# Patient Record
Sex: Female | Born: 1985 | Race: Black or African American | Hispanic: No | Marital: Single | Smoking: Never smoker
Health system: Southern US, Community
[De-identification: ages and names within clinical notes are randomized; demographics above are authoritative.]

## PROBLEM LIST (undated history)

## (undated) DIAGNOSIS — K811 Chronic cholecystitis: Secondary | ICD-10-CM

## (undated) DIAGNOSIS — I10 Essential (primary) hypertension: Secondary | ICD-10-CM

## (undated) DIAGNOSIS — N939 Abnormal uterine and vaginal bleeding, unspecified: Secondary | ICD-10-CM

## (undated) DIAGNOSIS — R0789 Other chest pain: Secondary | ICD-10-CM

## (undated) DIAGNOSIS — E119 Type 2 diabetes mellitus without complications: Secondary | ICD-10-CM

## (undated) DIAGNOSIS — K760 Fatty (change of) liver, not elsewhere classified: Secondary | ICD-10-CM

## (undated) DIAGNOSIS — K8020 Calculus of gallbladder without cholecystitis without obstruction: Secondary | ICD-10-CM

## (undated) HISTORY — DX: Chronic cholecystitis: K81.1

---
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
## 2007-03-10 ENCOUNTER — Emergency Department: Payer: Self-pay | Admitting: Internal Medicine

## 2007-03-28 ENCOUNTER — Emergency Department: Payer: Self-pay | Admitting: Emergency Medicine

## 2007-05-11 ENCOUNTER — Emergency Department: Payer: Self-pay | Admitting: Emergency Medicine

## 2007-05-29 ENCOUNTER — Emergency Department: Payer: Self-pay | Admitting: Emergency Medicine

## 2007-12-19 ENCOUNTER — Emergency Department: Payer: Self-pay | Admitting: Emergency Medicine

## 2008-01-23 ENCOUNTER — Emergency Department: Payer: Self-pay | Admitting: Emergency Medicine

## 2008-04-03 ENCOUNTER — Emergency Department: Payer: Self-pay | Admitting: Emergency Medicine

## 2008-08-19 ENCOUNTER — Observation Stay: Payer: Self-pay | Admitting: Obstetrics and Gynecology

## 2008-08-29 ENCOUNTER — Observation Stay: Payer: Self-pay | Admitting: Obstetrics & Gynecology

## 2008-09-14 ENCOUNTER — Inpatient Hospital Stay: Payer: Self-pay | Admitting: Obstetrics & Gynecology

## 2008-11-13 ENCOUNTER — Emergency Department: Payer: Self-pay | Admitting: Emergency Medicine

## 2010-07-01 ENCOUNTER — Emergency Department: Payer: Self-pay | Admitting: Unknown Physician Specialty

## 2011-03-15 ENCOUNTER — Emergency Department: Payer: Self-pay | Admitting: Emergency Medicine

## 2011-12-10 ENCOUNTER — Emergency Department: Payer: Self-pay | Admitting: Emergency Medicine

## 2011-12-10 LAB — RAPID INFLUENZA A&B ANTIGENS

## 2012-07-07 ENCOUNTER — Emergency Department: Payer: Self-pay | Admitting: Unknown Physician Specialty

## 2012-07-07 LAB — URINALYSIS, COMPLETE
Bilirubin,UR: NEGATIVE
Glucose,UR: NEGATIVE mg/dL (ref 0–75)
Leukocyte Esterase: NEGATIVE
Ph: 6 (ref 4.5–8.0)
Protein: NEGATIVE
RBC,UR: 2 /HPF (ref 0–5)
Specific Gravity: 1.024 (ref 1.003–1.030)
Squamous Epithelial: 5
WBC UR: 2 /HPF (ref 0–5)

## 2013-05-30 ENCOUNTER — Emergency Department: Payer: Self-pay | Admitting: Emergency Medicine

## 2013-05-30 LAB — CK TOTAL AND CKMB (NOT AT ARMC): CK, Total: 56 U/L

## 2013-05-30 LAB — URINALYSIS, COMPLETE
BILIRUBIN, UR: NEGATIVE
BLOOD: NEGATIVE
Glucose,UR: NEGATIVE mg/dL (ref 0–75)
Nitrite: NEGATIVE
PH: 7 (ref 4.5–8.0)
Protein: NEGATIVE
RBC,UR: 1 /HPF (ref 0–5)
Specific Gravity: 1.018 (ref 1.003–1.030)

## 2013-05-30 LAB — COMPREHENSIVE METABOLIC PANEL
ALBUMIN: 2.9 g/dL — AB (ref 3.4–5.0)
ALK PHOS: 46 U/L
ALT: 16 U/L (ref 12–78)
Anion Gap: 7 (ref 7–16)
BILIRUBIN TOTAL: 0.4 mg/dL (ref 0.2–1.0)
BUN: 5 mg/dL — ABNORMAL LOW (ref 7–18)
CALCIUM: 8.5 mg/dL (ref 8.5–10.1)
Chloride: 104 mmol/L (ref 98–107)
Co2: 26 mmol/L (ref 21–32)
Creatinine: 0.38 mg/dL — ABNORMAL LOW (ref 0.60–1.30)
Glucose: 82 mg/dL (ref 65–99)
OSMOLALITY: 270 (ref 275–301)
Potassium: 3.4 mmol/L — ABNORMAL LOW (ref 3.5–5.1)
SGOT(AST): 10 U/L — ABNORMAL LOW (ref 15–37)
Sodium: 137 mmol/L (ref 136–145)
Total Protein: 7.1 g/dL (ref 6.4–8.2)

## 2013-05-30 LAB — CBC
HCT: 35.3 % (ref 35.0–47.0)
HGB: 11.7 g/dL — ABNORMAL LOW (ref 12.0–16.0)
MCH: 27.2 pg (ref 26.0–34.0)
MCHC: 33.2 g/dL (ref 32.0–36.0)
MCV: 82 fL (ref 80–100)
PLATELETS: 173 10*3/uL (ref 150–440)
RBC: 4.32 10*6/uL (ref 3.80–5.20)
RDW: 13.8 % (ref 11.5–14.5)
WBC: 7.6 10*3/uL (ref 3.6–11.0)

## 2013-05-30 LAB — TROPONIN I: Troponin-I: <0.02 ng/mL

## 2013-09-03 ENCOUNTER — Inpatient Hospital Stay: Payer: Self-pay

## 2013-09-03 LAB — COMPREHENSIVE METABOLIC PANEL
ALBUMIN: 2.7 g/dL — AB (ref 3.4–5.0)
Alkaline Phosphatase: 139 U/L — ABNORMAL HIGH
Anion Gap: 9 (ref 7–16)
BUN: 7 mg/dL (ref 7–18)
Bilirubin,Total: 0.3 mg/dL (ref 0.2–1.0)
Calcium, Total: 8.4 mg/dL — ABNORMAL LOW (ref 8.5–10.1)
Chloride: 109 mmol/L — ABNORMAL HIGH (ref 98–107)
Co2: 23 mmol/L (ref 21–32)
Creatinine: 0.66 mg/dL (ref 0.60–1.30)
Glucose: 78 mg/dL (ref 65–99)
Osmolality: 278 (ref 275–301)
Potassium: 3.9 mmol/L (ref 3.5–5.1)
SGOT(AST): 18 U/L (ref 15–37)
SGPT (ALT): 10 U/L — ABNORMAL LOW
Sodium: 141 mmol/L (ref 136–145)
Total Protein: 7 g/dL (ref 6.4–8.2)

## 2013-09-03 LAB — CBC WITH DIFFERENTIAL/PLATELET
BASOS ABS: 0.1 10*3/uL (ref 0.0–0.1)
Basophil %: 0.9 %
EOS ABS: 0.1 10*3/uL (ref 0.0–0.7)
EOS PCT: 0.9 %
HCT: 38 % (ref 35.0–47.0)
HGB: 12.8 g/dL (ref 12.0–16.0)
Lymphocyte #: 3.1 10*3/uL (ref 1.0–3.6)
Lymphocyte %: 36.3 %
MCH: 28 pg (ref 26.0–34.0)
MCHC: 33.7 g/dL (ref 32.0–36.0)
MCV: 83 fL (ref 80–100)
MONOS PCT: 11.8 %
Monocyte #: 1 x10 3/mm — ABNORMAL HIGH (ref 0.2–0.9)
NEUTROS PCT: 50.1 %
Neutrophil #: 4.3 10*3/uL (ref 1.4–6.5)
Platelet: 172 10*3/uL (ref 150–440)
RBC: 4.57 10*6/uL (ref 3.80–5.20)
RDW: 13.8 % (ref 11.5–14.5)
WBC: 8.5 10*3/uL (ref 3.6–11.0)

## 2013-09-03 LAB — PROTEIN / CREATININE RATIO, URINE
CREATININE, URINE: 126.6 mg/dL — AB (ref 30.0–125.0)
PROTEIN/CREAT. RATIO: 142 mg/g{creat} (ref 0–200)
Protein, Random Urine: 18 mg/dL — ABNORMAL HIGH (ref 0–12)

## 2013-09-04 LAB — CBC WITH DIFFERENTIAL/PLATELET
BASOS ABS: 0 10*3/uL (ref 0.0–0.1)
BASOS PCT: 0.6 %
EOS PCT: 1 %
Eosinophil #: 0.1 10*3/uL (ref 0.0–0.7)
HCT: 38.1 % (ref 35.0–47.0)
HGB: 12.6 g/dL (ref 12.0–16.0)
Lymphocyte #: 3.1 10*3/uL (ref 1.0–3.6)
Lymphocyte %: 38.2 %
MCH: 27.8 pg (ref 26.0–34.0)
MCHC: 33 g/dL (ref 32.0–36.0)
MCV: 84 fL (ref 80–100)
Monocyte #: 0.7 x10 3/mm (ref 0.2–0.9)
Monocyte %: 8.8 %
Neutrophil #: 4.2 10*3/uL (ref 1.4–6.5)
Neutrophil %: 51.4 %
Platelet: 160 10*3/uL (ref 150–440)
RBC: 4.53 10*6/uL (ref 3.80–5.20)
RDW: 14.2 % (ref 11.5–14.5)
WBC: 8.1 10*3/uL (ref 3.6–11.0)

## 2013-09-04 LAB — BASIC METABOLIC PANEL WITH GFR
Anion Gap: 9
BUN: 3 mg/dL — ABNORMAL LOW
Calcium, Total: 7.4 mg/dL — ABNORMAL LOW
Chloride: 106 mmol/L
Co2: 23 mmol/L
Creatinine: 0.55 mg/dL — ABNORMAL LOW
EGFR (African American): >60
EGFR (Non-African Amer.): >60
Glucose: 80 mg/dL
Osmolality: 271
Potassium: 3.6 mmol/L
Sodium: 138 mmol/L

## 2013-09-04 LAB — PLATELET COUNT: Platelet: 175 10*3/uL (ref 150–440)

## 2013-09-05 LAB — HEMATOCRIT: HCT: 36.3 % (ref 35.0–47.0)

## 2013-09-07 LAB — PATHOLOGY REPORT

## 2014-05-22 NOTE — H&P (Signed)
L&D Evaluation:  History:  HPI -CC: HA and blurry vision -HPI: [age] y/o G2P1001 @ 37/2 (LMP=18wk u/s), with above CC. Preg c/b cHTN (poorly controlled and recently started on procardia xl), BMI 36, failed 1hr and passed 3hr  Patient states that starting at 1400 today she started having a HA and then took a tylenol but with no relief. She also states she's having some blurry vision but no spots in her vision, UCs/abdominal pain, VB, LOF or decreased FM. Last Procardia was at 2200 yesterday, which is her usual time   Patient's Surgical History none   Medications Pre [place]atal Vitamins  procardia 30mg  xl qhs   Allergies NKDA   Social History none   Family History Non-Contributory   Exam:  Vital Signs 150s/90s (SBP 170s/100s in ER), all other VS normal and stable   Urine Protein trace   General no apparent distress   Mental Status clear   Chest CTAB   Heart RRR, no MRGs   Abdomen gravid, non-tender   Estimated Fetal Weight Average for gestational age   Fetal Position cephalic   Edema 1+  in LE b/l and symmetric   Reflexes 1+  brachial   Clonus negative   Pelvic 1/50/high/posterior/intermediate; leopolds cephalic   Mebranes Intact   FHT 140 baseline, +accels, no decels, mod var   Ucx quiet   Impression:  Impression Pre-eclampsia with severe featus based on neuro s/s   Plan:  Comments *IUP: category I tracing, fetal status reassuring *Pre-eclampsia with severe features: based on neuro s/s. BPs are also above normal for her, which normally run in the low mild range. BPs not severe so need to start medications but since severe features will start Mg and proceed with delivery, which patient is amenable to -follow up admit pre-x labs; depending on values, can trending q4-8 hours -8/21 @ 37/0: 2653gm EFW 33%, AFI 11.3, ?fetal gallstones present *GBS: unknown but no RF. can follow up with swab from office during business hours *IOL: will wait for admit labs to come back  but likely miso IOL. *?fetal gallstones: tell peds at delivery  A pos/RI/VI/HIV neg/HepB neg/pap neg (per pt) 2015/tdap needed   Electronic Signatures: [place] BingPickens, Willim Turnage (MD)  (Signed 23-Aug-15 18:56)  Authored: L&D Evaluation   Last Updated: 23-Aug-15 18:56 by [place] BingPickens, Jovita Persing (MD)

## 2014-12-20 ENCOUNTER — Emergency Department
Admission: EM | Admit: 2014-12-20 | Discharge: 2014-12-20 | Disposition: A | Discharge status: Home / Self Care | Payer: Self-pay | Attending: Emergency Medicine | Admitting: Emergency Medicine

## 2014-12-20 ENCOUNTER — Encounter: Payer: Self-pay | Admitting: *Deleted

## 2014-12-20 DIAGNOSIS — J0100 Acute maxillary sinusitis, unspecified: Secondary | ICD-10-CM | POA: Insufficient documentation

## 2014-12-20 MED ORDER — CETIRIZINE HCL 10 MG PO TABS: 10.0000 mg | ORAL_TABLET | Freq: Every day | ORAL | Status: DC

## 2014-12-20 MED ORDER — AMOXICILLIN-POT CLAVULANATE 875-125 MG PO TABS: 1.0000 | ORAL_TABLET | Freq: Two times a day (BID) | ORAL | Status: DC

## 2014-12-20 MED ORDER — FLUTICASONE PROPIONATE 50 MCG/ACT NA SUSP: 1.0000 | Freq: Two times a day (BID) | NASAL | Status: DC

## 2014-12-20 NOTE — ED Notes (Signed)
States she is having some pressure behind eyes  Nasal congestion for about 1 week

## 2014-12-20 NOTE — ED Notes (Addendum)
Pt states headache and nasal congestion for 1 week, sore throat

## 2014-12-20 NOTE — ED Provider Notes (Signed)
[facility] Emergency Department Provider Note  ____________________________________________  Time seen: Approximately 11:01 AM  I have reviewed the triage vital signs and the nursing notes.   HISTORY  Chief Complaint Nasal Congestion and Headache    HPI Joan Alexander is a 29 y.o. female who presents emergency department for complaint of worsening sinus congestion, sinus pressure, postnasal drip. She states that initially symptoms began just like "allergies". She states that she started taking some Claritin however symptoms worsen. She states that she tried Alka-Seltzer cold and sinus with minimal relief. Patient states that pressure has been building in her cheeks and behind her eyes. She denies any fevers or chills, headache, visual acuity changes, difficulty breathing or swallowing, shortness of breath.   History reviewed. No pertinent past medical history.  There are no active problems to display for this patient.   History reviewed. No pertinent past surgical history.  Current Outpatient Rx  Name  Route  Sig  Dispense  Refill  . amoxicillin-clavulanate (AUGMENTIN) 875-125 MG tablet   Oral   Take 1 tablet by mouth 2 (two) times daily.   14 tablet   0   . cetirizine (ZYRTEC) 10 MG tablet   Oral   Take 1 tablet (10 mg total) by mouth daily.   30 tablet   0   . fluticasone (FLONASE) 50 MCG/ACT nasal spray   Each Nare   Place 1 spray into both nostrils 2 (two) times daily.   16 g   0     Allergies Review of patient's allergies indicates no known allergies.  History reviewed. No pertinent family history.  Social History Social History  Substance Use Topics  . Smoking status: Never Smoker   . Smokeless tobacco: None  . Alcohol Use: No    Review of Systems Constitutional: No fever/chills Eyes: No visual changes. ENT: No sore throat. Endorses nasal congestion and sinus pressure. Cardiovascular: Denies chest pain. Respiratory:  Denies shortness of breath. Gastrointestinal: No abdominal pain.  No nausea, no vomiting.  No diarrhea.  No constipation. Genitourinary: Negative for dysuria. Musculoskeletal: Negative for back pain. Skin: Negative for rash. Neurological: Negative for headaches, focal weakness or numbness.  10-point ROS otherwise negative.  ____________________________________________   PHYSICAL EXAM:  VITAL SIGNS: ED Triage Vitals  Enc Vitals Group     BP 12/20/14 0957 155/98 mmHg     Pulse Rate 12/20/14 0957 88     Resp 12/20/14 0957 18     Temp 12/20/14 0957 97.8 F (36.6 C)     Temp Source 12/20/14 0957 Oral     SpO2 12/20/14 0957 98 %     Weight 12/20/14 0957 225 lb (102.059 kg)     Height 12/20/14 0957 5\' 5"  (1.651 m)     Head Cir --      Peak Flow --      Pain Score 12/20/14 0957 7     Pain Loc --      Pain Edu? --      Excl. in GC? --     Constitutional: Alert and oriented. Well appearing and in no acute distress. Eyes: Conjunctivae are normal. PERRL. EOMI. Head: Atraumatic. Ears: EACs and TMs are unremarkable bilaterally. Nose: Moderate purulent congestion/rhinnorhea. Tender to percussion over the maxillary sinuses. Mouth/Throat: Mucous membranes are moist.  Oropharynx non-erythematous. Neck: No stridor.   Hematological/Lymphatic/Immunilogical: No cervical lymphadenopathy. Cardiovascular: Normal rate, regular rhythm. Grossly normal heart sounds.  Good peripheral circulation. Respiratory: Normal respiratory effort.  No retractions. Lungs CTAB.  Gastrointestinal: Soft and nontender. No distention. No abdominal bruits. No CVA tenderness. Musculoskeletal: No lower extremity tenderness nor edema.  No joint effusions. Neurologic:  Normal speech and language. No gross focal neurologic deficits are appreciated. No gait instability. Skin:  Skin is warm, dry and intact. No rash noted. Psychiatric: Mood and affect are normal. Speech and behavior are  normal.  ____________________________________________   LABS (all labs ordered are listed, but only abnormal results are displayed)  Labs Reviewed - No data to display ____________________________________________  EKG   ____________________________________________  RADIOLOGY   ____________________________________________   PROCEDURES  Procedure(s) performed: None  Critical Care performed: No  ____________________________________________   INITIAL IMPRESSION / ASSESSMENT AND PLAN / ED COURSE  Pertinent labs & imaging results that were available during my care of the patient were reviewed by me and considered in my medical decision making (see chart for details).  Patient's diagnosis is consistent with acute bacterial sinusitis. I'll place patient on Zyrtec, Flonase, and Augmentin. Patient verbalizes understanding of diagnosis and treatment plan and verbalizes compliance with same.   New Prescriptions   AMOXICILLIN-CLAVULANATE (AUGMENTIN) 875-125 MG TABLET    Take 1 tablet by mouth 2 (two) times daily.   CETIRIZINE (ZYRTEC) 10 MG TABLET    Take 1 tablet (10 mg total) by mouth daily.   FLUTICASONE (FLONASE) 50 MCG/ACT NASAL SPRAY    Place 1 spray into both nostrils 2 (two) times daily.    ____________________________________________   FINAL CLINICAL IMPRESSION(S) / ED DIAGNOSES  Final diagnoses:  Acute maxillary sinusitis, recurrence not specified      Racheal Patches, PA-C 12/20/14 1106  Rockne Menghini, MD 12/20/14 1553

## 2014-12-20 NOTE — Discharge Instructions (Signed)

## 2015-08-29 ENCOUNTER — Emergency Department
Admission: EM | Admit: 2015-08-29 | Discharge: 2015-08-29 | Disposition: A | Discharge status: Home / Self Care | Payer: Self-pay | Attending: Emergency Medicine | Admitting: Emergency Medicine

## 2015-08-29 ENCOUNTER — Emergency Department: Payer: Self-pay

## 2015-08-29 ENCOUNTER — Encounter: Payer: Self-pay | Admitting: Emergency Medicine

## 2015-08-29 DIAGNOSIS — K8020 Calculus of gallbladder without cholecystitis without obstruction: Secondary | ICD-10-CM | POA: Insufficient documentation

## 2015-08-29 DIAGNOSIS — I10 Essential (primary) hypertension: Secondary | ICD-10-CM | POA: Insufficient documentation

## 2015-08-29 HISTORY — DX: Essential (primary) hypertension: I10

## 2015-08-29 LAB — CBC
HEMATOCRIT: 40.5 % (ref 35.0–47.0)
HEMOGLOBIN: 14.2 g/dL (ref 12.0–16.0)
MCH: 28.5 pg (ref 26.0–34.0)
MCHC: 35 g/dL (ref 32.0–36.0)
MCV: 81.5 fL (ref 80.0–100.0)
Platelets: 206 10*3/uL (ref 150–440)
RBC: 4.96 MIL/uL (ref 3.80–5.20)
RDW: 14 % (ref 11.5–14.5)
WBC: 9.4 10*3/uL (ref 3.6–11.0)

## 2015-08-29 LAB — FIBRIN DERIVATIVES D-DIMER (ARMC ONLY): Fibrin derivatives D-dimer (ARMC): 371 (ref 0–499)

## 2015-08-29 LAB — COMPREHENSIVE METABOLIC PANEL
ALK PHOS: 56 U/L (ref 38–126)
ALT: 17 U/L (ref 14–54)
AST: 18 U/L (ref 15–41)
Albumin: 4.2 g/dL (ref 3.5–5.0)
Anion gap: 7 (ref 5–15)
BUN: 12 mg/dL (ref 6–20)
CHLORIDE: 107 mmol/L (ref 101–111)
CO2: 23 mmol/L (ref 22–32)
CREATININE: 0.63 mg/dL (ref 0.44–1.00)
Calcium: 9 mg/dL (ref 8.9–10.3)
GFR calc Af Amer: >60 mL/min (ref >60)
Glucose, Bld: 98 mg/dL (ref 65–99)
Potassium: 4 mmol/L (ref 3.5–5.1)
SODIUM: 137 mmol/L (ref 135–145)
Total Bilirubin: 0.3 mg/dL (ref 0.3–1.2)
Total Protein: 7.4 g/dL (ref 6.5–8.1)

## 2015-08-29 LAB — URINALYSIS COMPLETE WITH MICROSCOPIC (ARMC ONLY)
BACTERIA UA: NONE SEEN
Bilirubin Urine: NEGATIVE
Glucose, UA: NEGATIVE mg/dL
LEUKOCYTES UA: NEGATIVE
NITRITE: NEGATIVE
PH: 6 (ref 5.0–8.0)
PROTEIN: NEGATIVE mg/dL
Specific Gravity, Urine: 1.02 (ref 1.005–1.030)

## 2015-08-29 LAB — TROPONIN I: Troponin I: <0.03 ng/mL (ref <0.03)

## 2015-08-29 LAB — POCT PREGNANCY, URINE: Preg Test, Ur: NEGATIVE

## 2015-08-29 LAB — LIPASE, BLOOD: Lipase: 25 U/L (ref 11–51)

## 2015-08-29 MED ORDER — MORPHINE SULFATE (PF) 4 MG/ML IV SOLN
4.0000 mg | Freq: Once | INTRAVENOUS | Status: AC
  Administered 2015-08-29: 4 mg via INTRAVENOUS
  Filled 2015-08-29: qty 1

## 2015-08-29 MED ORDER — ONDANSETRON HCL 4 MG PO TABS: ORAL_TABLET | ORAL | 0 refills | Status: DC

## 2015-08-29 MED ORDER — DOCUSATE SODIUM 100 MG PO CAPS: ORAL_CAPSULE | ORAL | 0 refills | Status: DC

## 2015-08-29 MED ORDER — HYDROCODONE-ACETAMINOPHEN 5-325 MG PO TABS: 1.0000 | ORAL_TABLET | ORAL | 0 refills | Status: DC | PRN

## 2015-08-29 MED ORDER — ONDANSETRON HCL 4 MG/2ML IJ SOLN
4.0000 mg | INTRAMUSCULAR | Status: AC
  Administered 2015-08-29: 4 mg via INTRAVENOUS
  Filled 2015-08-29: qty 2

## 2015-08-29 NOTE — Discharge Instructions (Signed)
You have been seen in the Emergency Department (ED) for abdominal pain.  Your evaluation suggests that your pain is caused by gallstones.  Fortunately you do not need immediate surgery at this time, but it is important that you follow up with a surgeon as an outpatient; typically surgical removal of the gallbladder is the only thing that will definitively fix your issue.  Read through the included information about a bland diet, and use any prescribed medications as instructed.  Avoid smoking and alcohol use. ? ?Please follow up as instructed above regarding today?s emergent visit and the symptoms that are bothering you. ? ?Take Norco as prescribed. Do not drink alcohol, drive or participate in any other potentially dangerous activities while taking this medication as it may make you sleepy. Do not take this medication with any other sedating medications, either prescription or over-the-counter. If you were prescribed Percocet or Vicodin, do not take these with acetaminophen (Tylenol) as it is already contained within these medications. ?  ?This medication is an opiate (or narcotic) pain medication and can be habit forming.  Use it as little as possible to achieve adequate pain control.  Do not use or use it with extreme caution if you have a history of opiate abuse or dependence.  If you are on a pain contract with your primary care doctor or a pain specialist, be sure to let them know you were prescribed this medication today from the [facility].  This medication is intended for your use only - do not give any to anyone else and keep it in a secure place where nobody else, especially children, have access to it.  It will also cause or worsen constipation, so you may want to consider taking an over-the-counter stool softener while you are taking this medication. ? ?Return to the ED if your abdominal pain worsens or fails to improve, you develop bloody vomiting, bloody diarrhea, you are  unable to tolerate fluids due to vomiting, fever greater than 101, or other symptoms that concern you. ? ?

## 2015-08-29 NOTE — ED Provider Notes (Signed)
[facility] Emergency Department Provider Note  ____________________________________________   First MD Initiated Contact with Patient 08/29/15 2006     (approximate)  I have reviewed the triage vital signs and the nursing notes.   HISTORY  Chief Complaint Chest Pain    HPI Joan Alexander is a 30 y.o. female with no significant past medical history who presents for evaluation of chest pain/pressure, shortness of breath, and right upper quadrant pain.  She states that the right upper quadrant pain has been present for about 2 days but the chest pressure and sensation of shortness of breath only started today.  Nothing particular makes it better nor worse.  She has never had similar symptoms in the past.  She describes the pain in her chest as moderate, aching, and pressure-like, or a heaviness as if someone is sitting on her chest.  She has not felt any rapid heartbeat or palpitations.  She has no history of blood clots in her legs nor lungs, does not take exogenous estrogen, has not been on any long trips, no recent surgeries or immobilizations.  She states that her blood pressure occasionally runs high but that she is not any medication for it.  She does not smoke.   Past Medical History:  Diagnosis Date  . Hypertension     There are no active problems to display for this patient.   History reviewed. No pertinent surgical history.  Prior to Admission medications   Medication Sig Start Date End Date Taking? Authorizing Provider  amoxicillin-clavulanate (AUGMENTIN) 875-125 MG tablet Take 1 tablet by mouth 2 (two) times daily. 12/20/14   Delorise Royals Cuthriell, PA-C  cetirizine (ZYRTEC) 10 MG tablet Take 1 tablet (10 mg total) by mouth daily. 12/20/14   Delorise Royals Cuthriell, PA-C  docusate sodium (COLACE) 100 MG capsule Take 1 tablet once or twice daily as needed for constipation while taking narcotic pain medicine 08/29/15   Loleta Rose, MD  fluticasone  [facility]) 50 MCG/ACT nasal spray Place 1 spray into both nostrils 2 (two) times daily. 12/20/14   Delorise Royals Cuthriell, PA-C  HYDROcodone-acetaminophen (NORCO/VICODIN) 5-325 MG tablet Take 1-2 tablets by mouth every 4 (four) hours as needed for moderate pain. 08/29/15   Loleta Rose, MD  ondansetron [facility]) 4 MG tablet Take 1-2 tabs by mouth every 8 hours as needed for nausea/vomiting 08/29/15   Loleta Rose, MD    Allergies Review of patient's allergies indicates no known allergies.  History reviewed. No pertinent family history.  Social History Social History  Substance Use Topics  . Smoking status: Never Smoker  . Smokeless tobacco: Never Used  . Alcohol use No    Review of Systems Constitutional: No fever/chills Eyes: No visual changes. ENT: No sore throat. Cardiovascular: +chest pain/pressure Respiratory: +shortness of breath. Gastrointestinal: No abdominal pain.  No nausea, no vomiting.  No diarrhea.  No constipation. Genitourinary: Negative for dysuria. Musculoskeletal: Negative for back pain. Skin: Negative for rash. Neurological: Negative for headaches, focal weakness or numbness.  10-point ROS otherwise negative.  ____________________________________________   PHYSICAL EXAM:  VITAL SIGNS: ED Triage Vitals  Enc Vitals Group     BP 08/29/15 1641 (!) 180/96     Pulse Rate 08/29/15 1641 86     Resp 08/29/15 1641 20     Temp 08/29/15 1639 98.3 F (36.8 C)     Temp Source 08/29/15 1639 Oral     SpO2 08/29/15 1641 99 %     Weight 08/29/15 1637 228 lb (  103.4 kg)     Height 08/29/15 1637 5\' 5"  (1.651 m)     Head Circumference --      Peak Flow --      Pain Score 08/29/15 1637 7     Pain Loc --      Pain Edu? --      Excl. in GC? --     Constitutional: Alert and oriented. Well appearing and in no acute distress but tearful due to discomfort Eyes: Conjunctivae are normal. PERRL. EOMI. Head: Atraumatic. Nose: No congestion/rhinnorhea. Mouth/Throat: Mucous  membranes are moist.  Oropharynx non-erythematous. Neck: No stridor.  No meningeal signs.   Cardiovascular: Normal rate, regular rhythm. Good peripheral circulation. Grossly normal heart sounds.   Respiratory: Normal respiratory effort.  No retractions. Lungs CTAB. Gastrointestinal: Soft with mild tenderness to palpation of the right upper quadrant with positive Murphy sign.  No right lower quadrant tenderness. Musculoskeletal: No lower extremity tenderness nor edema. No gross deformities of extremities. Neurologic:  Normal speech and language. No gross focal neurologic deficits are appreciated.  Skin:  Skin is warm, dry and intact. No rash noted. Psychiatric: Mood and affect are normal. Speech and behavior are normal.  ____________________________________________   LABS (all labs ordered are listed, but only abnormal results are displayed)  Labs Reviewed  URINALYSIS COMPLETEWITH MICROSCOPIC ([facility] ONLY) - Abnormal; Notable for the following:       Result Value   Color, Urine YELLOW (*)    APPearance CLEAR (*)    Ketones, ur TRACE (*)    Hgb urine dipstick 1+ (*)    Squamous Epithelial / LPF 0-5 (*)    All other components within normal limits  CBC  TROPONIN I  COMPREHENSIVE METABOLIC PANEL  LIPASE, BLOOD  FIBRIN DERIVATIVES D-DIMER ([facility] ONLY)  POC URINE PREG, ED  POCT PREGNANCY, URINE   ____________________________________________  EKG  ED ECG REPORT I, Ashleyann Shoun, the attending physician, personally viewed and interpreted this ECG.  Date: 08/29/2015 EKG Time: 16:39 Rate: 95 Rhythm: normal sinus rhythm QRS Axis: Left axis deviation Intervals: LVH, otherwise normal ST/T Wave abnormalities: Non-specific ST segment / T-wave changes, but no evidence of acute ischemia. Conduction Disturbances: none Narrative Interpretation: unremarkable  ____________________________________________  RADIOLOGY   Dg Chest 2 View  Result Date: 08/29/2015 CLINICAL DATA:  Chest pain  started this morning. Shortness of breath. EXAM: CHEST  2 VIEW COMPARISON:  05/30/2013 FINDINGS: Both lungs are clear. No pleural effusions. Heart and mediastinum are within normal limits. There appears to be mild dextroscoliosis of the thoracic spine. Trachea is midline. IMPRESSION: No active cardiopulmonary disease. Electronically Signed   By: Richarda OverlieAdam  Henn M.D.   On: 08/29/2015 17:36   [place] Abdomen Limited Ruq  Result Date: 08/29/2015 CLINICAL DATA:  Right upper quadrant abdominal pain for 4 days with nausea. EXAM: US ABDOMEN LIMITED - RIGHT UPPER QUADRANT COMPARISON:  None. FINDINGS: Gallbladder: Tube level shadowing gallstones are present. The largest measures 9.6 mm. Wall thickness is within normal limits at 2.3 mm. There is no sonographic Murphy's sign. Common bile duct: Diameter: 3.8 mm, within normal limits. Liver: Mild diffuse increased echogenicity is present within the liver. No focal hepatic lesions are present. IMPRESSION: 1. Cholelithiasis without evidence for cholecystitis. 2. Hepatic steatosis. Electronically Signed   By: Marin Robertshristopher  Mattern M.D.   On: 08/29/2015 21:43    ____________________________________________   PROCEDURES  Procedure(s) performed:   Procedures   Critical Care performed: No ____________________________________________   INITIAL IMPRESSION / ASSESSMENT AND PLAN / ED  COURSE  Pertinent labs & imaging results that were available during my care of the patient were reviewed by me and considered in my medical decision making (see chart for details).  The patient is at low risk for pulmonary embolism, but I do not have a better explanation for her chest pain/pressure/shortness of breath, so I will attempt to rule her out with a d-dimer.  It is possible that her shortness of breath and what she experiences this chest pain is actually a result of gallbladder disease since her symptoms seemed of started with right upper quadrant pain which is persistent.  We will  evaluate with an ultrasound.  I am giving her morphine and Zofran for her pain and any nausea that may develop as result of morphine.  Her lab work, obtained in triage, is unremarkable.  Clinical Course  Value Comment By Time  Fibrin derivatives D-dimer [facility]): 371 D-dimer within normal limits. Loleta Roseory Donielle Kaigler, MD 08/17 2126   Pain well controlled.  Discussed results w/ patient including U/S.  Explained need for outpatient follow up.  Gave usual/customary return precautions. Patient understands and agrees with plan. Loleta Roseory Anneth Brunell, MD 08/17 2305    ____________________________________________  FINAL CLINICAL IMPRESSION(S) / ED DIAGNOSES  Final diagnoses:  Cholelithiasis without cholecystitis     MEDICATIONS GIVEN DURING THIS VISIT:  Medications  morphine 4 MG/ML injection 4 mg (4 mg Intravenous Given 08/29/15 2039)  ondansetron (ZOFRAN) injection 4 mg (4 mg Intravenous Given 08/29/15 2039)     NEW OUTPATIENT MEDICATIONS STARTED DURING THIS VISIT:  New Prescriptions   DOCUSATE SODIUM (COLACE) 100 MG CAPSULE    Take 1 tablet once or twice daily as needed for constipation while taking narcotic pain medicine   HYDROCODONE-ACETAMINOPHEN (NORCO/VICODIN) 5-325 MG TABLET    Take 1-2 tablets by mouth every 4 (four) hours as needed for moderate pain.   ONDANSETRON (ZOFRAN) 4 MG TABLET    Take 1-2 tabs by mouth every 8 hours as needed for nausea/vomiting      Note:  This document was prepared using Dragon voice recognition software and may include unintentional dictation errors.    Loleta Roseory Gracemarie Skeet, MD 08/29/15 [id number]

## 2015-08-29 NOTE — ED Triage Notes (Signed)
Pt c/o right sided chest pain that is intermittent since this AM. Has also pain in RUQ. Also c/o SHOB. Has had nausea but denies vomiting.

## 2015-08-29 NOTE — ED Notes (Signed)
Patient transported to Ultrasound 

## 2015-09-05 ENCOUNTER — Ambulatory Visit: Payer: Self-pay | Admitting: General Surgery

## 2015-10-13 ENCOUNTER — Emergency Department
Admission: EM | Admit: 2015-10-13 | Discharge: 2015-10-13 | Disposition: A | Discharge status: Home / Self Care | Payer: Medicaid Other | Attending: Student | Admitting: Student

## 2015-10-13 ENCOUNTER — Emergency Department: Payer: Medicaid Other

## 2015-10-13 DIAGNOSIS — Z792 Long term (current) use of antibiotics: Secondary | ICD-10-CM | POA: Insufficient documentation

## 2015-10-13 DIAGNOSIS — Y999 Unspecified external cause status: Secondary | ICD-10-CM | POA: Insufficient documentation

## 2015-10-13 DIAGNOSIS — Y929 Unspecified place or not applicable: Secondary | ICD-10-CM | POA: Insufficient documentation

## 2015-10-13 DIAGNOSIS — S92511A Displaced fracture of proximal phalanx of right lesser toe(s), initial encounter for closed fracture: Secondary | ICD-10-CM | POA: Insufficient documentation

## 2015-10-13 DIAGNOSIS — W228XXA Striking against or struck by other objects, initial encounter: Secondary | ICD-10-CM | POA: Insufficient documentation

## 2015-10-13 DIAGNOSIS — Z7951 Long term (current) use of inhaled steroids: Secondary | ICD-10-CM | POA: Insufficient documentation

## 2015-10-13 DIAGNOSIS — Z791 Long term (current) use of non-steroidal anti-inflammatories (NSAID): Secondary | ICD-10-CM | POA: Insufficient documentation

## 2015-10-13 DIAGNOSIS — Y9389 Activity, other specified: Secondary | ICD-10-CM | POA: Insufficient documentation

## 2015-10-13 DIAGNOSIS — I10 Essential (primary) hypertension: Secondary | ICD-10-CM | POA: Insufficient documentation

## 2015-10-13 DIAGNOSIS — S92911A Unspecified fracture of right toe(s), initial encounter for closed fracture: Secondary | ICD-10-CM

## 2015-10-13 DIAGNOSIS — Z79899 Other long term (current) drug therapy: Secondary | ICD-10-CM | POA: Insufficient documentation

## 2015-10-13 MED ORDER — OXYCODONE HCL 5 MG PO TABS: 5.0000 mg | ORAL_TABLET | Freq: Four times a day (QID) | ORAL | 0 refills | Status: DC | PRN

## 2015-10-13 NOTE — ED Notes (Signed)
Pt placed in post-op shoe per MD Inocencio HomesGayle. Pt refused crutches.

## 2015-10-13 NOTE — ED Triage Notes (Signed)
Patient reports right foot pain after "hitting" it on something.

## 2015-10-13 NOTE — ED Notes (Signed)
MD Inocencio HomesGayle aware of BP at discharge. Pt instructed to follow up with PCP for BP.

## 2015-10-13 NOTE — ED Notes (Signed)
Report to kim, rn.  

## 2015-10-13 NOTE — ED Notes (Signed)
Pt verbalized understanding of discharge instructions. NAD at this time. 

## 2015-10-13 NOTE — ED Provider Notes (Addendum)
[facility] Emergency Department Provider Note   ____________________________________________   Time seen approximately 7:20 AM  I have reviewed the triage vital signs and the nursing notes.   HISTORY  Chief Complaint Foot Pain    HPI Joan Alexander is a 30 y.o. female with history of hypertension who presents for evaluation of traumatic right foot pain which began suddenly last night when she accidentally hit her foot on a hard suitcase while chasing her 21-year-old child around, constant since onset, moderate, worse with weightbearing. She denies any other injuries. She denies any recent illness or any other complaints including no chest pain, difficulty breathing, vomiting, diarrhea, fevers or chills.Denies headache, vision change, numbness or weakness.   Past Medical History:  Diagnosis Date  . Hypertension     There are no active problems to display for this patient.   No past surgical history on file.  Prior to Admission medications   Medication Sig Start Date End Date Taking? Authorizing Provider  amoxicillin-clavulanate (AUGMENTIN) 875-125 MG tablet Take 1 tablet by mouth 2 (two) times daily. 12/20/14   Delorise Royals Cuthriell, PA-C  cetirizine (ZYRTEC) 10 MG tablet Take 1 tablet (10 mg total) by mouth daily. 12/20/14   Delorise Royals Cuthriell, PA-C  docusate sodium (COLACE) 100 MG capsule Take 1 tablet once or twice daily as needed for constipation while taking narcotic pain medicine 08/29/15   Loleta Rose, MD  fluticasone [facility]) 50 MCG/ACT nasal spray Place 1 spray into both nostrils 2 (two) times daily. 12/20/14   Delorise Royals Cuthriell, PA-C  HYDROcodone-acetaminophen (NORCO/VICODIN) 5-325 MG tablet Take 1-2 tablets by mouth every 4 (four) hours as needed for moderate pain. 08/29/15   Loleta Rose, MD  ondansetron (ZOFRAN) 4 MG tablet Take 1-2 tabs by mouth every 8 hours as needed for nausea/vomiting 08/29/15   Loleta Rose, MD  oxyCODONE (ROXICODONE)  5 MG immediate release tablet Take 1 tablet (5 mg total) by mouth every 6 (six) hours as needed for moderate pain. Do not drive while taking this medication. 10/13/15   Gayla Doss, MD    Allergies Review of patient's allergies indicates no known allergies.  No family history on file.  Social History Social History  Substance Use Topics  . Smoking status: Never Smoker  . Smokeless tobacco: Never Used  . Alcohol use No    Review of Systems Constitutional: No fever/chills Eyes: No visual changes. ENT: No sore throat. Cardiovascular: Denies chest pain. Respiratory: Denies shortness of breath. Gastrointestinal: No abdominal pain.  No nausea, no vomiting.  No diarrhea.  No constipation. Genitourinary: Negative for dysuria. Musculoskeletal: Negative for back pain. Skin: Negative for rash. Neurological: Negative for headaches, focal weakness or numbness.  10-point ROS otherwise negative.  ____________________________________________   PHYSICAL EXAM:  VITAL SIGNS: ED Triage Vitals  Enc Vitals Group     BP 10/13/15 0629 (!) 160/107     Pulse Rate 10/13/15 0629 70     Resp 10/13/15 0629 16     Temp 10/13/15 0629 97.8 F (36.6 C)     Temp Source 10/13/15 0629 Oral     SpO2 10/13/15 0629 100 %     Weight 10/13/15 0622 225 lb (102.1 kg)     Height 10/13/15 0622 5\' 4"  (1.626 m)     Head Circumference --      Peak Flow --      Pain Score 10/13/15 0622 8     Pain Loc --      Pain  Edu? --      Excl. in GC? --     Constitutional: Alert and oriented. Well appearing and in no acute distress. Eyes: Conjunctivae are normal. PERRL. EOMI. Head: Atraumatic. Nose: No congestion/rhinnorhea. Mouth/Throat: Mucous membranes are moist.  Oropharynx non-erythematous. Neck: No stridor. Supple without meningismus. Cardiovascular: Normal rate, regular rhythm. Grossly normal heart sounds.  Good peripheral circulation. Respiratory: Normal respiratory effort.  No retractions. Lungs  CTAB. Gastrointestinal: Soft and nontender. No distention.  No CVA tenderness. Genitourinary: Deferred Musculoskeletal: Moderate tenderness and swelling just proximal to the right fifth toe on the lateral side of the right foot, wiggles all toes, and tendons are intact, 2+ right DP pulse. No tenderness to palpation over the heads of the metatarsals or in the midfoot. Neurologic:  Normal speech and language. No gross focal neurologic deficits are appreciated.  Skin:  Skin is warm, dry and intact. No rash noted. Psychiatric: Mood and affect are normal. Speech and behavior are normal.  ____________________________________________   LABS (all labs ordered are listed, but only abnormal results are displayed)  Labs Reviewed - No data to display ____________________________________________  EKG  none ____________________________________________  RADIOLOGY  Xray right foot IMPRESSION:  Oblique mildly displaced extra-articular fracture of the shaft of  fifth proximal phalanx.    ____________________________________________   PROCEDURES  Procedure(s) performed: None  Procedures  Critical Care performed: No  ____________________________________________   INITIAL IMPRESSION / ASSESSMENT AND PLAN / ED COURSE  Pertinent labs & imaging results that were available during my care of the patient were reviewed by me and considered in my medical decision making (see chart for details).  Joan Alexander is a 30 y.o. female with history of hypertension who presents for evaluation of traumatic right foot pain which began suddenly last night when she accidentally hit her foot on a hard suitcase while chasing her 30-year-old child around. On exam, she is very well-appearing and in no acute distress. Vital signs notable for hypertension but she is asymptomatic, I have encouraged her to follow up with a primary care doctor in 1 week for recheck. She reports her blood pressure has typically been  elevated in the ER in the past due to pain. She is Neurovascularly intact in the right foot with moderate swelling and tenderness just proximal to the fifth toe. Plain films confirm fifth proximal phalanx fracture. We'll discharge with walking boot, crutches, oxycodone for pain and orthopedic follow-up. We discussed return precautions and she is comfortable with the discharge plan. DC home.  Clinical Course     ____________________________________________   FINAL CLINICAL IMPRESSION(S) / ED DIAGNOSES  Final diagnoses:  Phalanx of the foot fracture, right, closed, initial encounter  Essential hypertension      NEW MEDICATIONS STARTED DURING THIS VISIT:  Discharge Medication List as of 10/13/2015  7:37 AM    START taking these medications   Details  oxyCODONE (ROXICODONE) 5 MG immediate release tablet Take 1 tablet (5 mg total) by mouth every 6 (six) hours as needed for moderate pain. Do not drive while taking this medication., Starting Sun 10/13/2015, Print         Note:  This document was prepared using Dragon voice recognition software and may include unintentional dictation errors.    Gayla DossEryka A Watson Robarge, MD 10/13/15 [id number]    Gayla DossEryka A Selinda Korzeniewski, MD 10/13/15 [id number]    Gayla DossEryka A Roshell Brigham, MD 10/13/15 [phone]

## 2016-06-21 ENCOUNTER — Emergency Department
Admission: EM | Admit: 2016-06-21 | Discharge: 2016-06-21 | Disposition: A | Discharge status: Home / Self Care | Payer: Medicaid Other | Attending: Emergency Medicine | Admitting: Emergency Medicine

## 2016-06-21 ENCOUNTER — Emergency Department: Payer: Medicaid Other

## 2016-06-21 ENCOUNTER — Encounter: Payer: Self-pay | Admitting: Emergency Medicine

## 2016-06-21 DIAGNOSIS — Z79899 Other long term (current) drug therapy: Secondary | ICD-10-CM | POA: Insufficient documentation

## 2016-06-21 DIAGNOSIS — R519 Headache, unspecified: Secondary | ICD-10-CM

## 2016-06-21 DIAGNOSIS — R51 Headache: Secondary | ICD-10-CM | POA: Insufficient documentation

## 2016-06-21 DIAGNOSIS — I10 Essential (primary) hypertension: Secondary | ICD-10-CM | POA: Diagnosis not present

## 2016-06-21 DIAGNOSIS — R110 Nausea: Secondary | ICD-10-CM | POA: Diagnosis not present

## 2016-06-21 DIAGNOSIS — H538 Other visual disturbances: Secondary | ICD-10-CM | POA: Insufficient documentation

## 2016-06-21 HISTORY — DX: Calculus of gallbladder without cholecystitis without obstruction: K80.20

## 2016-06-21 LAB — CBC WITH DIFFERENTIAL/PLATELET
Basophils Absolute: 0.1 10*3/uL (ref 0–0.1)
Basophils Relative: 1 %
EOS PCT: 4 %
Eosinophils Absolute: 0.2 10*3/uL (ref 0–0.7)
HCT: 41.5 % (ref 35.0–47.0)
HEMOGLOBIN: 14.5 g/dL (ref 12.0–16.0)
LYMPHS PCT: 51 %
Lymphs Abs: 3.3 10*3/uL (ref 1.0–3.6)
MCH: 27.8 pg (ref 26.0–34.0)
MCHC: 34.9 g/dL (ref 32.0–36.0)
MCV: 79.6 fL — AB (ref 80.0–100.0)
Monocytes Absolute: 0.5 10*3/uL (ref 0.2–0.9)
Monocytes Relative: 8 %
NEUTROS PCT: 36 %
Neutro Abs: 2.3 10*3/uL (ref 1.4–6.5)
PLATELETS: 234 10*3/uL (ref 150–440)
RBC: 5.22 MIL/uL — AB (ref 3.80–5.20)
RDW: 13.9 % (ref 11.5–14.5)
WBC: 6.5 10*3/uL (ref 3.6–11.0)

## 2016-06-21 LAB — PROTIME-INR
INR: 0.96
Prothrombin Time: 12.8 seconds (ref 11.4–15.2)

## 2016-06-21 LAB — COMPREHENSIVE METABOLIC PANEL
ALT: 16 U/L (ref 14–54)
AST: 20 U/L (ref 15–41)
Albumin: 4.1 g/dL (ref 3.5–5.0)
Alkaline Phosphatase: 70 U/L (ref 38–126)
Anion gap: 6 (ref 5–15)
BILIRUBIN TOTAL: 0.5 mg/dL (ref 0.3–1.2)
BUN: 10 mg/dL (ref 6–20)
CHLORIDE: 101 mmol/L (ref 101–111)
CO2: 28 mmol/L (ref 22–32)
Calcium: 9 mg/dL (ref 8.9–10.3)
Creatinine, Ser: 0.59 mg/dL (ref 0.44–1.00)
Glucose, Bld: 210 mg/dL — ABNORMAL HIGH (ref 65–99)
POTASSIUM: 3.6 mmol/L (ref 3.5–5.1)
Sodium: 135 mmol/L (ref 135–145)
TOTAL PROTEIN: 8 g/dL (ref 6.5–8.1)

## 2016-06-21 LAB — HCG, QUANTITATIVE, PREGNANCY: hCG, Beta Chain, Quant, S: 1 m[IU]/mL (ref <5)

## 2016-06-21 MED ORDER — PROCHLORPERAZINE EDISYLATE 5 MG/ML IJ SOLN
10.0000 mg | Freq: Once | INTRAMUSCULAR | Status: AC
  Administered 2016-06-21: 10 mg via INTRAVENOUS
  Filled 2016-06-21: qty 2

## 2016-06-21 MED ORDER — LIDOCAINE HCL (PF) 1 % IJ SOLN
30.0000 mL | Freq: Once | INTRAMUSCULAR | Status: DC
  Filled 2016-06-21: qty 30

## 2016-06-21 MED ORDER — KETOROLAC TROMETHAMINE 30 MG/ML IJ SOLN
15.0000 mg | Freq: Once | INTRAMUSCULAR | Status: AC
  Administered 2016-06-21: 15 mg via INTRAVENOUS
  Filled 2016-06-21: qty 1

## 2016-06-21 MED ORDER — DIPHENHYDRAMINE HCL 50 MG/ML IJ SOLN
50.0000 mg | Freq: Once | INTRAMUSCULAR | Status: AC
  Administered 2016-06-21: 50 mg via INTRAVENOUS
  Filled 2016-06-21: qty 1

## 2016-06-21 NOTE — ED Provider Notes (Signed)
[facility] Emergency Department Provider Note  ____________________________________________   First MD Initiated Contact with Patient 06/21/16 [phone]     (approximate)  I have reviewed the triage vital signs and the nursing notes.   HISTORY  Chief Complaint Migraine and Blurred Vision    HPI Joan Alexander is a 31 y.o. female who self presents to the emergency department with 3 days of headache unlike any headache she's ever had before. It began when she awoke 3 days ago and has been gradual in onset but became severe and felt like a "screw driving into the left side of my head". It is slightly worse in the morning. It is associated with nausea but not photophobia. No neck pain. She said the only headache she's had that is been somewhat similar was several years ago when she had preeclampsia. Her last menstrual period was one week ago. She was seen at urgent care this morning and her blood pressure was high so she was advised to come to the emergency department.   Past Medical History:  Diagnosis Date  . Gallstones   . Hypertension     There are no active problems to display for this patient.   History reviewed. No pertinent surgical history.  Prior to Admission medications   Medication Sig Start Date End Date Taking? Authorizing Provider  amoxicillin-clavulanate (AUGMENTIN) 875-125 MG tablet Take 1 tablet by mouth 2 (two) times daily. 12/20/14   Cuthriell, Delorise Royals, PA-C  cetirizine (ZYRTEC) 10 MG tablet Take 1 tablet (10 mg total) by mouth daily. 12/20/14   Cuthriell, Delorise Royals, PA-C  docusate sodium (COLACE) 100 MG capsule Take 1 tablet once or twice daily as needed for constipation while taking narcotic pain medicine 08/29/15   Loleta Rose, MD  fluticasone [facility]) 50 MCG/ACT nasal spray Place 1 spray into both nostrils 2 (two) times daily. 12/20/14   Cuthriell, Delorise Royals, PA-C  HYDROcodone-acetaminophen (NORCO/VICODIN) 5-325 MG tablet Take 1-2  tablets by mouth every 4 (four) hours as needed for moderate pain. 08/29/15   Loleta Rose, MD  ondansetron (ZOFRAN) 4 MG tablet Take 1-2 tabs by mouth every 8 hours as needed for nausea/vomiting 08/29/15   Loleta Rose, MD  oxyCODONE (ROXICODONE) 5 MG immediate release tablet Take 1 tablet (5 mg total) by mouth every 6 (six) hours as needed for moderate pain. Do not drive while taking this medication. 10/13/15   Gayla Doss, MD    Allergies Lisinopril  No family history on file.  Social History Social History  Substance Use Topics  . Smoking status: Never Smoker  . Smokeless tobacco: Never Used  . Alcohol use No    Review of Systems Constitutional: No fever/chills Eyes: Positive visual changes. ENT: No sore throat. Cardiovascular: Denies chest pain. Respiratory: Denies shortness of breath. Gastrointestinal: No abdominal pain.  No nausea, no vomiting.  No diarrhea.  No constipation. Genitourinary: Negative for dysuria. Musculoskeletal: Negative for back pain. Skin: Negative for rash. Neurological: Positive for headache   ____________________________________________   PHYSICAL EXAM:  VITAL SIGNS: ED Triage Vitals  Enc Vitals Group     BP 06/21/16 0853 (!) 214/131     Pulse Rate 06/21/16 0853 95     Resp 06/21/16 0853 18     Temp 06/21/16 0853 98.2 F (36.8 C)     Temp Source 06/21/16 0853 Oral     SpO2 06/21/16 0853 98 %     Weight 06/21/16 0854 236 lb (107 kg)  Height 06/21/16 0854 5\' 4"  (1.626 m)     Head Circumference --      Peak Flow --      Pain Score 06/21/16 0853 7     Pain Loc --      Pain Edu? --      Excl. in GC? --     Constitutional: Alert and oriented x 4 well appearing nontoxic no diaphoresis speaks in full, clear sentences Eyes: PERRL EOMI.   Visual Acuity  Right Eye Distance: 20/25 Left Eye Distance: 20/25 Bilateral Distance: 20/20  Right Eye Near:   Left Eye Near:    Bilateral Near:     Head: Atraumatic. Nose: No  congestion/rhinnorhea. Mouth/Throat: No trismus Neck: No stridor.   Cardiovascular: Normal rate, regular rhythm. Grossly normal heart sounds.  Good peripheral circulation. Respiratory: Normal respiratory effort.  No retractions. Lungs CTAB and moving good air Gastrointestinal: Obese soft nontender Musculoskeletal: No lower extremity edema   Neurologic:  Normal speech and language. No gross focal neurologic deficits are appreciated. Cranial nerves II through XII intact 5 out of 5 biceps triceps hip flexion and hip extension plantar flexion dorsiflexion 2+ DTRs no ankle clonus sensation intact to light touch throughout Skin:  Skin is warm, dry and intact. No rash noted. Psychiatric: Mood and affect are normal. Speech and behavior are normal.    ____________________________________________   DIFFERENTIAL  Migraine, cerebral venous thrombosis, pseudotumor cerebri, tension headache ____________________________________________   LABS (all labs ordered are listed, but only abnormal results are displayed)  Labs Reviewed  COMPREHENSIVE METABOLIC PANEL - Abnormal; Notable for the following:       Result Value   Glucose, Bld 210 (*)    All other components within normal limits  CBC WITH DIFFERENTIAL/PLATELET - Abnormal; Notable for the following:    RBC 5.22 (*)    MCV 79.6 (*)    All other components within normal limits  HCG, QUANTITATIVE, PREGNANCY  PROTIME-INR    Labs unremarkable __________________________________________  EKG   ____________________________________________  RADIOLOGY  Head CT with no acute disease ____________________________________________   PROCEDURES  Procedure(s) performed: no  Procedures  Critical Care performed: no  Observation: no ____________________________________________   INITIAL IMPRESSION / ASSESSMENT AND PLAN / ED COURSE  Pertinent labs & imaging results that were available during my care of the patient were reviewed by me  and considered in my medical decision making (see chart for details).  Arrival the patient is uncomfortable appearing with a headache although normal neurological exam. Differential is broad but includes migraine headache as well as pseudotumor given her visual disturbances and her morbid obesity. She was given Compazine and Benadryl and Toradol with near complete resolution of her symptoms. We discussed a lumbar puncture and she agreed so I obtained a CT of the head and then when I went in to perform the lumbar puncture the patient deferred saying she would prefer to follow-up with neurology as an outpatient. I explained to her that without the clear diagnosis of pseudotumor we could not initiate treatment and that I was concerned that she may develop worsening symptoms or possible permanent neurological deficits, however she still deferred the lumbar puncture and said she would follow-up as an outpatient. She is discharged home in improved condition.      ____________________________________________   FINAL CLINICAL IMPRESSION(S) / ED DIAGNOSES  Final diagnoses:  Acute nonintractable headache, unspecified headache type      NEW MEDICATIONS STARTED DURING THIS VISIT:  Discharge Medication List as of 06/21/2016  12:01 PM       Note:  This document was prepared using Dragon voice recognition software and may include unintentional dictation errors.     Merrily Brittle, MD 06/22/16 1247

## 2016-06-21 NOTE — ED Notes (Signed)
Kim RN aware of patient placement.

## 2016-06-21 NOTE — ED Triage Notes (Signed)
Patient presents to the ED with severe headache x 3 days.  Patient reports nausea with headache and states this morning she started having blurry vision in her left eye.  Patient went to urgent care this morning and was sent to the ED due to hypertension.  Patient states she normally has high blood pressure but does not take medication for it although it is not usually as high as it is today.  Patient reports sharp vaginal pain x 2 days as well as chest pain on Thursday.  Patient denies chest pain at this time.

## 2016-06-21 NOTE — Discharge Instructions (Signed)
If you change your mind and would like a lumbar puncture, please come back to the ED at any time and we would be more than happy to continue to care for you.  Please make an appointment to see Dr. Malvin JohnsPotter for further evaluation of your headaches.  Please return to the ED for any concerns such as worsening headache, blurred vision, numbness, weakness, or for any other concerns.  It was a pleasure to take care of you today, and thank you for coming to our emergency department.  If you have any questions or concerns before leaving please ask the nurse to grab me and I'm more than happy to go through your aftercare instructions again.  If you were prescribed any opioid pain medication today such as Norco, Vicodin, Percocet, morphine, hydrocodone, or oxycodone please make sure you do not drive when you are taking this medication as it can alter your ability to drive safely.  If you have any concerns once you are home that you are not improving or are in fact getting worse before you can make it to your follow-up appointment, please do not hesitate to call [phone] and come back for further evaluation.  Merrily BrittleNeil Jeffry Vogelsang MD  Results for orders placed or performed during the hospital encounter of 06/21/16  hCG, quantitative, pregnancy  Result Value Ref Range   hCG, Beta Chain, Quant, S 1 <5 mIU/mL  Comprehensive metabolic panel  Result Value Ref Range   Sodium 135 135 - 145 mmol/L   Potassium 3.6 3.5 - 5.1 mmol/L   Chloride 101 101 - 111 mmol/L   CO2 28 22 - 32 mmol/L   Glucose, Bld 210 (H) 65 - 99 mg/dL   BUN 10 6 - 20 mg/dL   Creatinine, Ser 1.610.59 0.44 - 1.00 mg/dL   Calcium 9.0 8.9 - 09.610.3 mg/dL   Total Protein 8.0 6.5 - 8.1 g/dL   Albumin 4.1 3.5 - 5.0 g/dL   AST 20 15 - 41 U/L   ALT 16 14 - 54 U/L   Alkaline Phosphatase 70 38 - 126 U/L   Total Bilirubin 0.5 0.3 - 1.2 mg/dL   GFR calc non Af Amer >60 >60 mL/min   GFR calc Af Amer >60 >60 mL/min   Anion gap 6 5 - 15  CBC with Differential    Result Value Ref Range   WBC 6.5 3.6 - 11.0 K/uL   RBC 5.22 (H) 3.80 - 5.20 MIL/uL   Hemoglobin 14.5 12.0 - 16.0 g/dL   HCT 04.541.5 40.935.0 - 81.147.0 %   MCV 79.6 (L) 80.0 - 100.0 fL   MCH 27.8 26.0 - 34.0 pg   MCHC 34.9 32.0 - 36.0 g/dL   RDW 91.413.9 78.211.5 - 95.614.5 %   Platelets 234 150 - 440 K/uL   Neutrophils Relative % 36 %   Neutro Abs 2.3 1.4 - 6.5 K/uL   Lymphocytes Relative 51 %   Lymphs Abs 3.3 1.0 - 3.6 K/uL   Monocytes Relative 8 %   Monocytes Absolute 0.5 0.2 - 0.9 K/uL   Eosinophils Relative 4 %   Eosinophils Absolute 0.2 0 - 0.7 K/uL   Basophils Relative 1 %   Basophils Absolute 0.1 0 - 0.1 K/uL  Protime-INR  Result Value Ref Range   Prothrombin Time 12.8 11.4 - 15.2 seconds   INR 0.96    Ct Head Wo Contrast  Result Date: 06/21/2016 CLINICAL DATA:  For 3 days with left eye blurred vision Hypertension. EXAM: CT HEAD  WITHOUT CONTRAST TECHNIQUE: Contiguous axial images were obtained from the base of the skull through the vertex without intravenous contrast. COMPARISON:  None. FINDINGS: Brain: The ventricles are normal in size and configuration. There is no intracranial mass, hemorrhage, extra-axial fluid collection, or midline shift. Gray-white compartments are normal. No acute infarct evident. Vascular: No hyperdense vessel.  No vascular calcification evident. Skull: Bony calvarium appears intact. Sinuses/Orbits: Visualized paranasal sinuses are clear. Visualized orbits appear symmetric bilaterally. Other: Visualized mastoid air cells are clear. IMPRESSION: Study within normal limits. Electronically Signed   By: Bretta Bang III M.D.   On: 06/21/2016 10:32

## 2016-09-24 IMAGING — US US ABDOMEN LIMITED
1 series · 14 of 25 positions shown · non-contrast
Comparison: None.

CLINICAL DATA: Right upper quadrant abdominal pain for 4 days with
nausea.

EXAM:
US ABDOMEN LIMITED - RIGHT UPPER QUADRANT

[Series 1: us abdomen limited · 0.27mm/px · 14 of 53 slices shown]
[im 1/53]
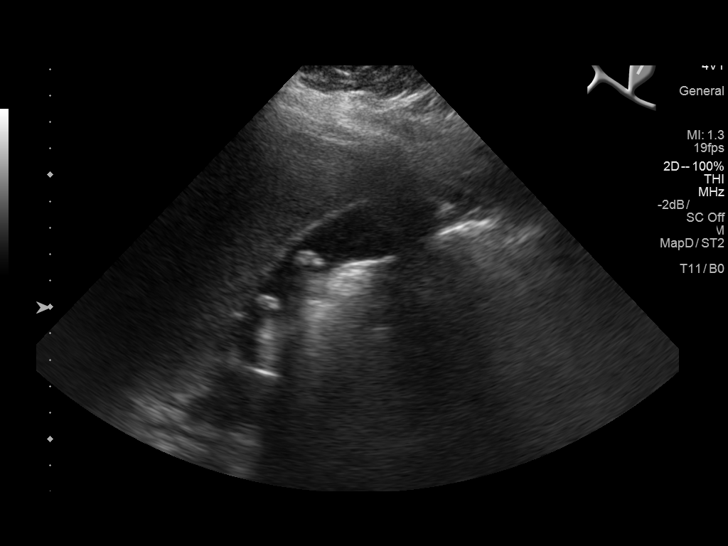
[im 5/53]
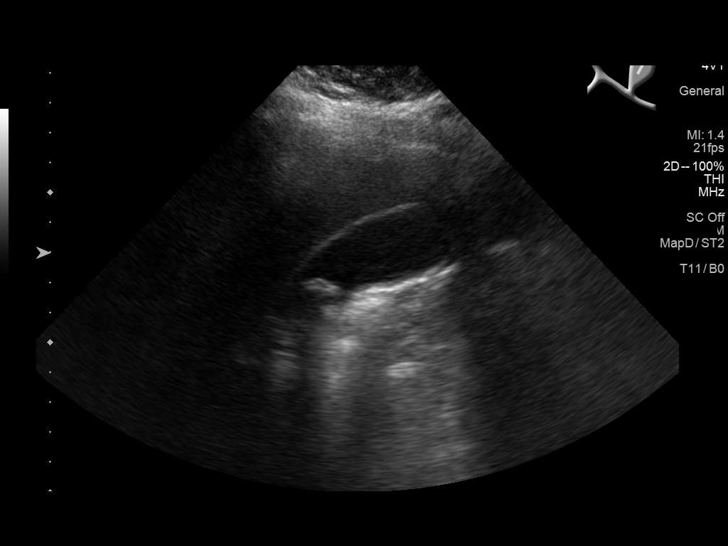
[im 9/53]
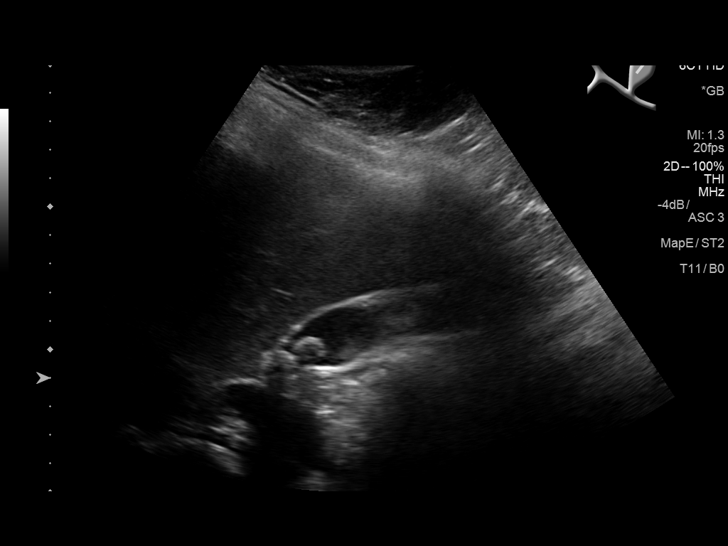
[im 14/53]
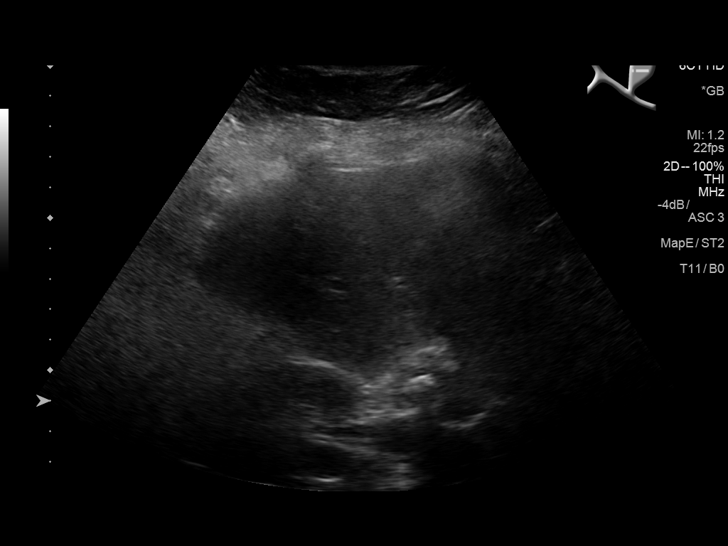
[im 18/53]
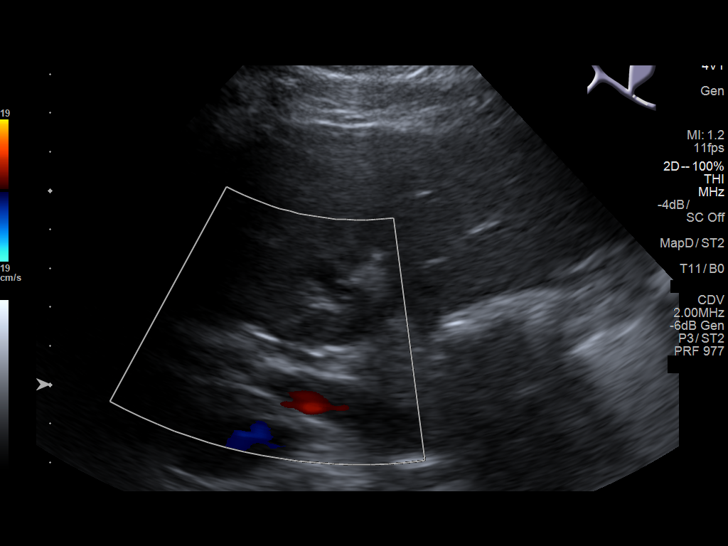
[im 20/53]
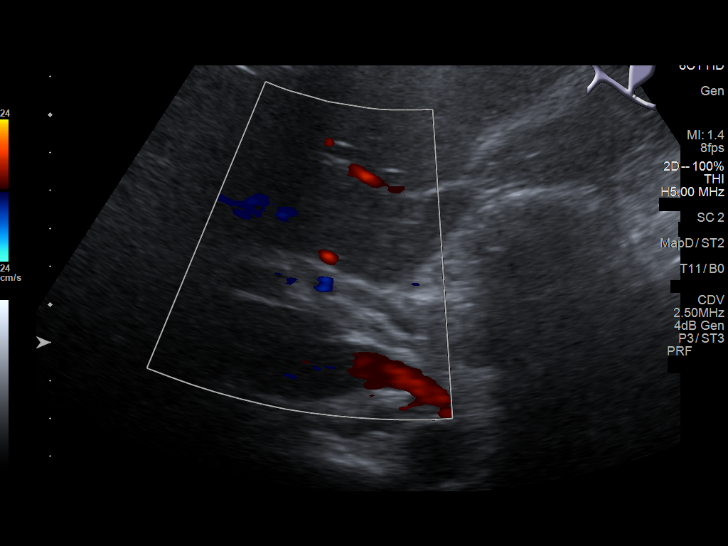
[im 24/53]
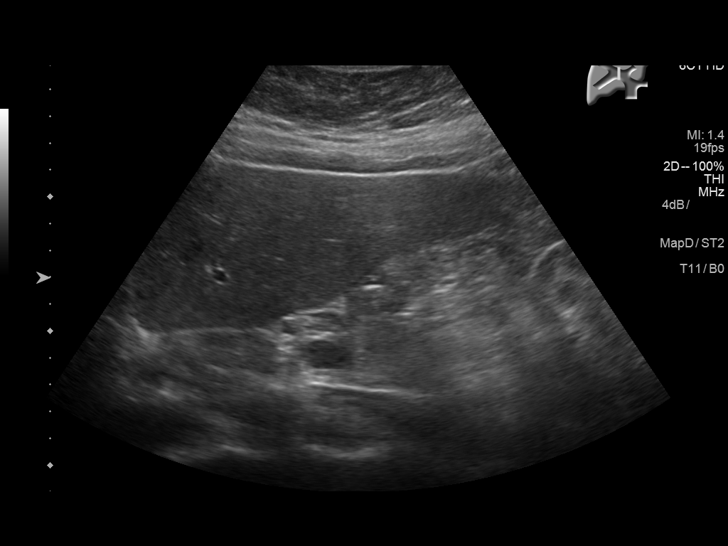
[im 29/53]
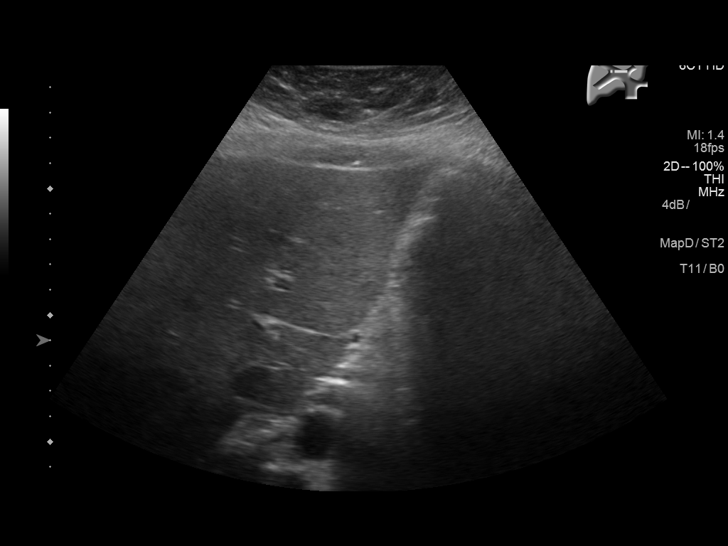
[im 33/53]
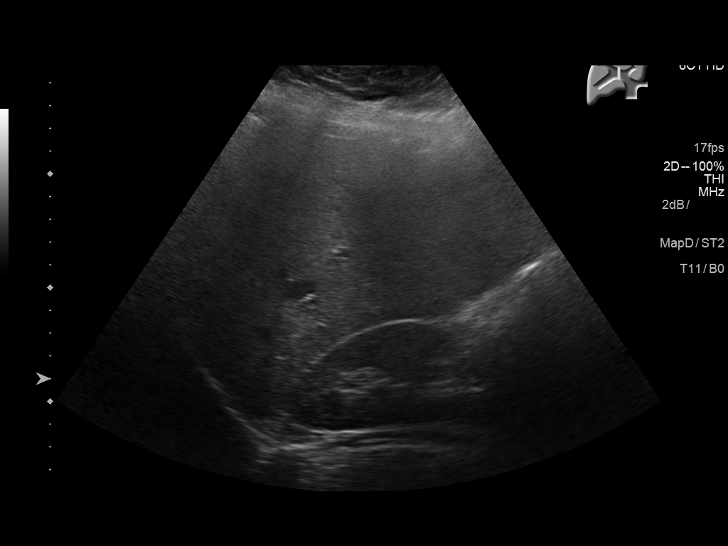
[im 35/53]
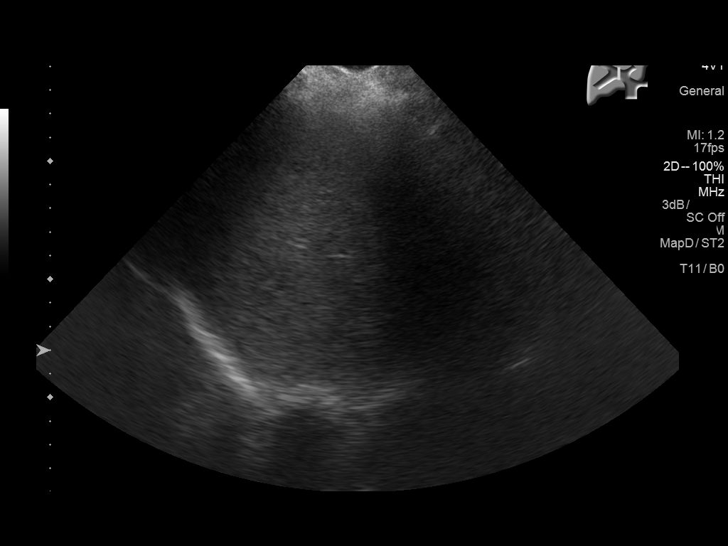
[im 40/53]
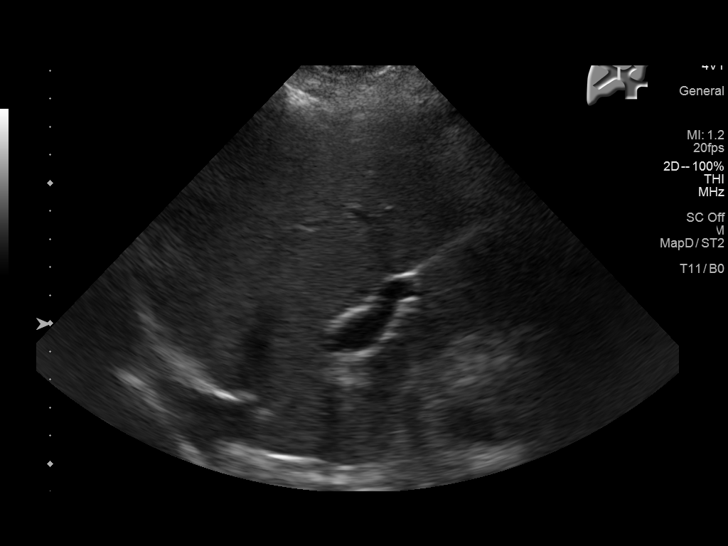
[im 44/53]
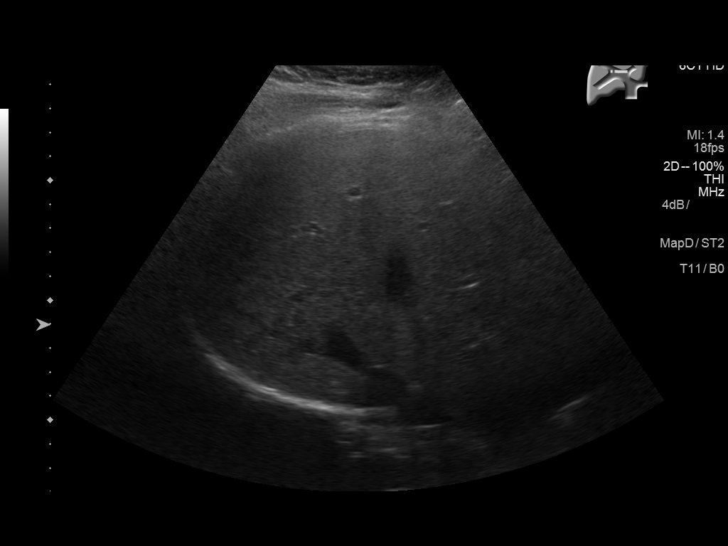
[im 48/53]
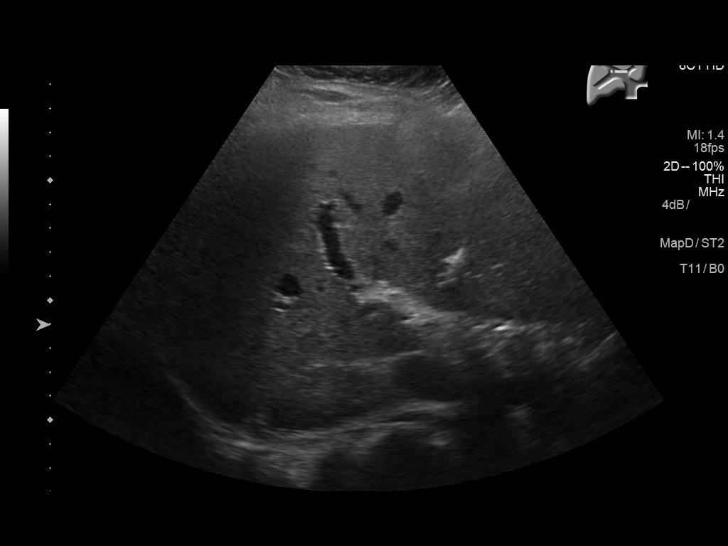
[im 53/53]
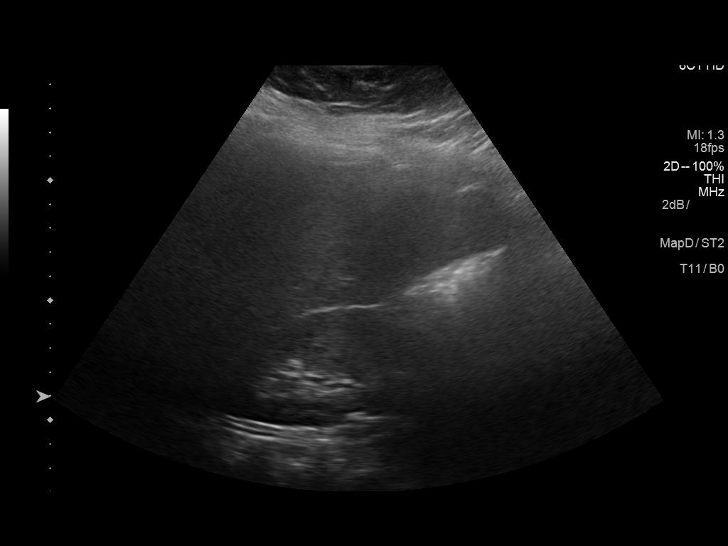

[14 of 25 positions shown; findings below may reference images not displayed]

FINDINGS: Gallbladder:

Tube level shadowing gallstones are present. The largest measures
9.6 mm. Wall thickness is within normal limits at 2.3 mm. There is
no sonographic Murphy's sign.

Common bile duct:

Diameter: 3.8 mm, within normal limits.

Liver:

Mild diffuse increased echogenicity is present within the liver. No
focal hepatic lesions are present.
IMPRESSION: 1. Cholelithiasis without evidence for cholecystitis.
2. Hepatic steatosis.

## 2016-12-31 ENCOUNTER — Other Ambulatory Visit: Payer: Self-pay

## 2016-12-31 ENCOUNTER — Emergency Department: Payer: Medicaid Other

## 2016-12-31 ENCOUNTER — Encounter: Payer: Self-pay | Admitting: Emergency Medicine

## 2016-12-31 ENCOUNTER — Emergency Department
Admission: EM | Admit: 2016-12-31 | Discharge: 2016-12-31 | Disposition: A | Discharge status: Home / Self Care | Payer: Medicaid Other | Attending: Emergency Medicine | Admitting: Emergency Medicine

## 2016-12-31 DIAGNOSIS — E119 Type 2 diabetes mellitus without complications: Secondary | ICD-10-CM | POA: Diagnosis not present

## 2016-12-31 DIAGNOSIS — Z79899 Other long term (current) drug therapy: Secondary | ICD-10-CM | POA: Diagnosis not present

## 2016-12-31 DIAGNOSIS — R200 Anesthesia of skin: Secondary | ICD-10-CM | POA: Diagnosis present

## 2016-12-31 DIAGNOSIS — I10 Essential (primary) hypertension: Secondary | ICD-10-CM | POA: Insufficient documentation

## 2016-12-31 HISTORY — DX: Type 2 diabetes mellitus without complications: E11.9

## 2016-12-31 LAB — CBC WITH DIFFERENTIAL/PLATELET
BASOS ABS: 0 10*3/uL (ref 0–0.1)
Basophils Relative: 0 %
EOS PCT: 6 %
Eosinophils Absolute: 0.4 10*3/uL (ref 0–0.7)
HCT: 42.3 % (ref 35.0–47.0)
Hemoglobin: 14 g/dL (ref 12.0–16.0)
LYMPHS PCT: 54 %
Lymphs Abs: 3.9 10*3/uL — ABNORMAL HIGH (ref 1.0–3.6)
MCH: 27.3 pg (ref 26.0–34.0)
MCHC: 33.2 g/dL (ref 32.0–36.0)
MCV: 82.4 fL (ref 80.0–100.0)
Monocytes Absolute: 0.5 10*3/uL (ref 0.2–0.9)
Monocytes Relative: 8 %
Neutro Abs: 2.2 10*3/uL (ref 1.4–6.5)
Neutrophils Relative %: 32 %
Platelets: 267 10*3/uL (ref 150–440)
RBC: 5.13 MIL/uL (ref 3.80–5.20)
RDW: 14.4 % (ref 11.5–14.5)
WBC: 7.1 10*3/uL (ref 3.6–11.0)

## 2016-12-31 LAB — BASIC METABOLIC PANEL
ANION GAP: 7 (ref 5–15)
BUN: 9 mg/dL (ref 6–20)
CALCIUM: 9.1 mg/dL (ref 8.9–10.3)
CO2: 29 mmol/L (ref 22–32)
Chloride: 100 mmol/L — ABNORMAL LOW (ref 101–111)
Creatinine, Ser: 0.54 mg/dL (ref 0.44–1.00)
GFR calc Af Amer: >60 mL/min (ref >60)
GFR calc non Af Amer: >60 mL/min (ref >60)
GLUCOSE: 92 mg/dL (ref 65–99)
Potassium: 3.1 mmol/L — ABNORMAL LOW (ref 3.5–5.1)
Sodium: 136 mmol/L (ref 135–145)

## 2016-12-31 LAB — URINALYSIS, COMPLETE (UACMP) WITH MICROSCOPIC
Bacteria, UA: NONE SEEN
Bilirubin Urine: NEGATIVE
GLUCOSE, UA: NEGATIVE mg/dL
Hgb urine dipstick: NEGATIVE
Ketones, ur: NEGATIVE mg/dL
Nitrite: NEGATIVE
Protein, ur: NEGATIVE mg/dL
SPECIFIC GRAVITY, URINE: 1.011 (ref 1.005–1.030)
pH: 6 (ref 5.0–8.0)

## 2016-12-31 LAB — POCT PREGNANCY, URINE: Preg Test, Ur: NEGATIVE

## 2016-12-31 MED ORDER — LABETALOL HCL 5 MG/ML IV SOLN
10.0000 mg | Freq: Once | INTRAVENOUS | Status: AC
  Administered 2016-12-31: 10 mg via INTRAVENOUS
  Filled 2016-12-31: qty 4

## 2016-12-31 NOTE — ED Triage Notes (Signed)
Pt here for numbness to entire left side of body from face to leg. Describes as feeling asleep. Denies any pain. No facial droop, speech clear. No weakness. Grip equal.  No previous hx same.  Discussed with dr Scotty Courtstafford, labs accordingly.  Decreased sensation on left but no other symptoms.

## 2016-12-31 NOTE — Discharge Instructions (Signed)
Return to the emergency department immediately for new, worsening, or recurrent numbness, weakness, confusion or change in your mental status, vision changes, difficulty walking or with coordination, inability to grip, or any other new or worsening symptoms that concern you.  You should follow-up with your primary care doctor, and we have also provided a referral to a neurologist.

## 2016-12-31 NOTE — ED Notes (Signed)
First nurse note: Pt arrived via EMS from home for reports of elevated BP, left arm numbness that began approximately 1 pm today and feeling panicky. EMS reports 180/80, no neuro deficits. No apparent distress on arrival, speech clear.

## 2016-12-31 NOTE — ED Provider Notes (Signed)
-----------------------------------------   11:16 PM on 12/31/2016 -----------------------------------------  I received signout on this patient from Dr. Cyril LoosenKinner.  Per Dr. Cyril LoosenKinner, the patient was pending MRI to evaluate left-sided neurologic symptoms.  The plan was that if the MRI was negative, that patient would be able to be discharged home.  The MRI is negative for acute findings.  There is no evidence of acute CVA.  Patient's symptoms have resolved.  She feels much better, and feels comfortable to go home.  I explained that the symptoms could be due to atypical migraine, TIA, or multiple other causes.  I instructed her to follow-up with her primary care doctor, and will provide referral to a neurologist.  I gave her extensive return precautions, and the patient expressed understanding.   Dionne BucySiadecki, Kashmere Staffa, MD 12/31/16 2318

## 2016-12-31 NOTE — ED Provider Notes (Signed)
[facility] Emergency Department Provider Note   ____________________________________________    I have reviewed the triage vital signs and the nursing notes.   HISTORY  Chief Complaint Numbness     HPI Joan Alexander is a 31 y.o. female with a history of diabetes and high blood pressure presents with complaints of a numbness sensation on the left side of her body.  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  She did not think anything of it and continued working.  However then the symptoms seem to progress to her left arm and left leg.  She described that her leg felt heavy and that she was dragging it.  This is never happened before.  Currently she reports most of her symptoms have improved although she still feels a heaviness in her left leg.   Past Medical History:  Diagnosis Date  . Diabetes mellitus without complication (HCC)   . Gallstones   . Hypertension     There are no active problems to display for this patient.   History reviewed. No pertinent surgical history.  Prior to Admission medications   Medication Sig Start Date End Date Taking? Authorizing Provider  amoxicillin-clavulanate (AUGMENTIN) 875-125 MG tablet Take 1 tablet by mouth 2 (two) times daily. 12/20/14   Cuthriell, Delorise RoyalsJonathan D, PA-C  cetirizine (ZYRTEC) 10 MG tablet Take 1 tablet (10 mg total) by mouth daily. 12/20/14   Cuthriell, Delorise RoyalsJonathan D, PA-C  docusate sodium (COLACE) 100 MG capsule Take 1 tablet once or twice daily as needed for constipation while taking narcotic pain medicine 08/29/15   Loleta RoseForbach, Cory, MD  fluticasone [facility](FLONASE) 50 MCG/ACT nasal spray Place 1 spray into both nostrils 2 (two) times daily. 12/20/14   Cuthriell, Delorise RoyalsJonathan D, PA-C  HYDROcodone-acetaminophen (NORCO/VICODIN) 5-325 MG tablet Take 1-2 tablets by mouth every 4 (four) hours as needed for moderate pain. 08/29/15    Loleta RoseForbach, Cory, MD  ondansetron (ZOFRAN) 4 MG tablet Take 1-2 tabs by mouth every 8 hours as needed for nausea/vomiting 08/29/15   Loleta RoseForbach, Cory, MD  oxyCODONE (ROXICODONE) 5 MG immediate release tablet Take 1 tablet (5 mg total) by mouth every 6 (six) hours as needed for moderate pain. Do not drive while taking this medication. 10/13/15   Gayla DossGayle, Eryka A, MD     Allergies Lisinopril  History reviewed. No pertinent family history.  Social History Social History   Tobacco Use  . Smoking status: Never Smoker  . Smokeless tobacco: Never Used  Substance Use Topics  . Alcohol use: No  . Drug use: Not on file    Review of Systems  Constitutional: No fever/chills Eyes: No visual changes.  ENT: No sore throat. Cardiovascular: Denies chest pain. Respiratory: Denies shortness of breath. Gastrointestinal: No abdominal pain.  No nausea, no vomiting.   Genitourinary: Negative for dysuria. Musculoskeletal: Negative for back pain.  No neck pain Skin: Negative for rash. Neurological: No headache, other symptoms as above   ____________________________________________   PHYSICAL EXAM:  VITAL SIGNS: ED Triage Vitals [12/31/16 1747]  Enc Vitals Group     BP (!) 159/119     Pulse Rate 82     Resp 18     Temp 98.3 F (36.8 C)     Temp Source Oral     SpO2 100 %     Weight 93 kg (205 lb)     Height 1.626 m (  5\' 4" )     Head Circumference      Peak Flow      Pain Score      Pain Loc      Pain Edu?      Excl. in GC?     Constitutional: Alert and oriented. No acute distress. Pleasant and interactive Eyes: Conjunctivae are normal.  Head: Atraumatic. Nose: No congestion/rhinnorhea. Mouth/Throat: Mucous membranes are moist.   Neck:  Painless ROM, no vertebral tenderness to palpation cardiovascular: Normal rate, regular rhythm. Grossly normal heart sounds.  Good peripheral circulation. Respiratory: Normal respiratory effort.  No retractions. Lungs CTAB. Gastrointestinal: Soft and  nontender. No distention.  No CVA tenderness. Genitourinary: deferred Musculoskeletal: No lower extremity tenderness nor edema.  Warm and well perfused Neurologic:  Normal speech and language. No gross focal neurologic deficits are appreciated.  Cranial nerves II through XII are normal, possibly some mildly decreased strength in the left leg, otherwise normal exam Skin:  Skin is warm, dry and intact. No rash noted. Psychiatric: Mood and affect are normal. Speech and behavior are normal.  ____________________________________________   LABS (all labs ordered are listed, but only abnormal results are displayed)  Labs Reviewed  CBC WITH DIFFERENTIAL/PLATELET - Abnormal; Notable for the following components:      Result Value   Lymphs Abs 3.9 (*)    All other components within normal limits  BASIC METABOLIC PANEL - Abnormal; Notable for the following components:   Potassium 3.1 (*)    Chloride 100 (*)    All other components within normal limits  URINALYSIS, COMPLETE (UACMP) WITH MICROSCOPIC - Abnormal; Notable for the following components:   Color, Urine YELLOW (*)    APPearance HAZY (*)    Leukocytes, UA SMALL (*)    Squamous Epithelial / LPF 0-5 (*)    All other components within normal limits  POC URINE PREG, ED  POCT PREGNANCY, URINE   ____________________________________________  EKG  None ____________________________________________  RADIOLOGY  CT head unremarkable ____________________________________________   PROCEDURES  Procedure(s) performed: No  Procedures   Critical Care performed: No ____________________________________________   INITIAL IMPRESSION / ASSESSMENT AND PLAN / ED COURSE  Pertinent labs & imaging results that were available during my care of the patient were reviewed by me and considered in my medical decision making (see chart for details).  HPI is concerning.  Differential diagnosis includes CVA, MS, ICH, etc.  CT head is greatly  reassuring.  Certainly she is at a low risk for CVA however she does have significantly elevated blood pressure and diabetes.  I will send her for MRI of the brain.  We will treat blood pressure with labetalol  ----------------------------------------- 8:49 PM on 12/31/2016 -----------------------------------------  I have asked Dr. Marisa SeverinSiadecki to follow-up on MRI results and discuss with patient    ____________________________________________   FINAL CLINICAL IMPRESSION(S) / ED DIAGNOSES  Final diagnoses:  Left sided numbness        Note:  This document was prepared using Dragon voice recognition software and may include unintentional dictation errors.    Jene EveryKinner, Azariya Freeman, MD 12/31/16 2049

## 2016-12-31 NOTE — ED Notes (Signed)
UA PREG results = Neg

## 2017-02-09 ENCOUNTER — Encounter
Admission: RE | Admit: 2017-02-09 | Discharge: 2017-02-09 | Disposition: A | Discharge status: Home / Self Care | Payer: Medicaid Other | Source: Ambulatory Visit | Attending: Otolaryngology | Admitting: Otolaryngology

## 2017-02-09 ENCOUNTER — Other Ambulatory Visit: Payer: Self-pay

## 2017-02-09 DIAGNOSIS — R9431 Abnormal electrocardiogram [ECG] [EKG]: Secondary | ICD-10-CM | POA: Diagnosis not present

## 2017-02-09 DIAGNOSIS — I517 Cardiomegaly: Secondary | ICD-10-CM | POA: Diagnosis not present

## 2017-02-09 DIAGNOSIS — E119 Type 2 diabetes mellitus without complications: Secondary | ICD-10-CM | POA: Diagnosis not present

## 2017-02-09 DIAGNOSIS — I10 Essential (primary) hypertension: Secondary | ICD-10-CM | POA: Diagnosis not present

## 2017-02-09 DIAGNOSIS — Z01818 Encounter for other preprocedural examination: Secondary | ICD-10-CM | POA: Diagnosis not present

## 2017-02-09 LAB — BASIC METABOLIC PANEL
Anion gap: 7 (ref 5–15)
BUN: 13 mg/dL (ref 6–20)
CALCIUM: 9 mg/dL (ref 8.9–10.3)
CO2: 31 mmol/L (ref 22–32)
CREATININE: 0.59 mg/dL (ref 0.44–1.00)
Chloride: 100 mmol/L — ABNORMAL LOW (ref 101–111)
GFR calc Af Amer: >60 mL/min (ref >60)
Glucose, Bld: 123 mg/dL — ABNORMAL HIGH (ref 65–99)
Potassium: 2.9 mmol/L — ABNORMAL LOW (ref 3.5–5.1)
SODIUM: 138 mmol/L (ref 135–145)

## 2017-02-09 NOTE — Pre-Procedure Instructions (Signed)
Met B results sent to Dr. Pryor Ochoa and Anesthesia for review.

## 2017-02-09 NOTE — Patient Instructions (Signed)
Your procedure is scheduled on: Thursday 02/11/17 Report to DAY SURGERY DEPARTMENT LOCATED ON 2ND FLOOR MEDICAL MALL ENTRANCE. To find out your arrival time please call [phone] between 1PM - 3PM on Wednesday 02/10/17.  Remember: Instructions that are not followed completely may result in serious medical risk, up to and including death, or upon the discretion of your surgeon and anesthesiologist your surgery may need to be rescheduled.     _X__ 1. Do not eat food after midnight the night before your procedure.                 No gum chewing or hard candies. You may drink clear liquids up to 2 hours                 before you are scheduled to arrive for your surgery- DO not drink clear                 liquids within 2 hours of the start of your surgery.                 Clear Liquids include:  water, apple juice without pulp, clear carbohydrate                 drink such as Clearfast of Gartorade, Black Coffee or Tea (Do not add                 anything to coffee or tea).  __X__2.  On the morning of surgery brush your teeth with toothpaste and water, you  may rinse your mouth with mouthwash if you wish.  Do not swallow any               toothpaste of mouthwash.     _X__ 3.  No Alcohol for 24 hours before or after surgery.   _X__ 4.  Do Not Smoke or use e-cigarettes For 24 Hours Prior to Your Surgery.                 Do not use any chewable tobacco products for at least 6 hours prior to                 surgery.  ____  5.  Bring all medications with you on the day of surgery if instructed.   __X__  6.  Notify your doctor if there is any change in your medical condition      (cold, fever, infections).     Do not wear jewelry, make-up, hairpins, clips or nail polish. Do not wear lotions, powders, or perfumes. You may wear deodorant. Do not shave 48 hours prior to surgery. Men may shave face and neck. Do not bring valuables to the hospital.    [facility] is not responsible for any  belongings or valuables.  Contacts, dentures/partials or BODY PIERCINGS may not be worn into surgery. Leave your suitcase in the car. After surgery it may be brought to your room. For patients admitted to the hospital, discharge time is determined by your treatment team.   Patients discharged the day of surgery will not be allowed to drive home.   Please read over the following fact sheets that you were given:   MRSA Information   __X__ Take these medicines the morning of surgery with A SIP OF WATER:    1. AMLODIPINE  2.   3.   4.  5.  6.  ____ Fleet Enema (as directed)   __ __ Use CHG  Soap as directed  ____ Use inhalers on the day of surgery  __X__ Stop metformin/Janumet/Farxiga 2 days prior to surgery    ____ Take 1/2 of usual insulin dose the night before surgery. No insulin the morning          of surgery.   ____ Stop Blood Thinners Coumadin/Plavix/Xarelto/Pleta/Pradaxa/Eliquis/Effient/Aspirin  on   __X__ Stop Anti-inflammatories such as Advil, Ibuprofen, Motrin, BC or Goodies  Powder, Naprosyn, Naproxen, Aleve    __X__ Stop herbal supplements, fish oil or vitamin E until after surgery.    ____ Bring C-Pap to the hospital.

## 2017-02-10 NOTE — Pre-Procedure Instructions (Addendum)
EKG COMPARED WITH 2017,  ALSO NOTIFIED DR Karlton LemonKARENZ OF KT 2.9. LM FOR BECKY AT DR VAUGHT'S WITH RERQUERST TO GET KT SUPP STARTED ASAP TODAY. WILL RECHECK IN AM  BECKY RETURNED CALL AND HAS GOTTEN KT SUPP BEING STARTED

## 2017-02-11 ENCOUNTER — Ambulatory Visit
Admission: RE | Admit: 2017-02-11 | Discharge: 2017-02-11 | Disposition: A | Discharge status: Home / Self Care | Payer: Medicaid Other | Source: Ambulatory Visit | Attending: Otolaryngology | Admitting: Otolaryngology

## 2017-02-11 ENCOUNTER — Ambulatory Visit: Payer: Medicaid Other | Admitting: Anesthesiology

## 2017-02-11 ENCOUNTER — Encounter
Admission: RE | Disposition: A | Discharge status: Home / Self Care | Payer: Self-pay | Source: Ambulatory Visit | Attending: Otolaryngology

## 2017-02-11 ENCOUNTER — Other Ambulatory Visit: Payer: Self-pay

## 2017-02-11 ENCOUNTER — Encounter: Payer: Self-pay | Admitting: *Deleted

## 2017-02-11 DIAGNOSIS — J351 Hypertrophy of tonsils: Secondary | ICD-10-CM | POA: Diagnosis present

## 2017-02-11 DIAGNOSIS — Z79899 Other long term (current) drug therapy: Secondary | ICD-10-CM | POA: Diagnosis not present

## 2017-02-11 DIAGNOSIS — I10 Essential (primary) hypertension: Secondary | ICD-10-CM | POA: Insufficient documentation

## 2017-02-11 DIAGNOSIS — E119 Type 2 diabetes mellitus without complications: Secondary | ICD-10-CM | POA: Diagnosis not present

## 2017-02-11 DIAGNOSIS — J3501 Chronic tonsillitis: Secondary | ICD-10-CM | POA: Insufficient documentation

## 2017-02-11 HISTORY — PX: TONSILLECTOMY AND ADENOIDECTOMY: SHX28

## 2017-02-11 LAB — POCT I-STAT 4, (NA,K, GLUC, HGB,HCT)
GLUCOSE: 133 mg/dL — AB (ref 65–99)
HCT: 41 % (ref 36.0–46.0)
Hemoglobin: 13.9 g/dL (ref 12.0–15.0)
POTASSIUM: 3.9 mmol/L (ref 3.5–5.1)
Sodium: 140 mmol/L (ref 135–145)

## 2017-02-11 LAB — GLUCOSE, CAPILLARY
GLUCOSE-CAPILLARY: 125 mg/dL — AB (ref 65–99)
Glucose-Capillary: 149 mg/dL — ABNORMAL HIGH (ref 65–99)

## 2017-02-11 LAB — POCT PREGNANCY, URINE: PREG TEST UR: NEGATIVE

## 2017-02-11 SURGERY — TONSILLECTOMY AND ADENOIDECTOMY
Anesthesia: General | Laterality: Bilateral

## 2017-02-11 MED ORDER — PHENYLEPHRINE HCL 10 MG/ML IJ SOLN
INTRAMUSCULAR | Status: DC | PRN
  Administered 2017-02-11: 100 ug via INTRAVENOUS

## 2017-02-11 MED ORDER — DEXMEDETOMIDINE HCL 200 MCG/2ML IV SOLN
INTRAVENOUS | Status: DC | PRN
  Administered 2017-02-11: 12 ug via INTRAVENOUS

## 2017-02-11 MED ORDER — BUPIVACAINE HCL (PF) 0.5 % IJ SOLN
INTRAMUSCULAR | Status: AC
  Filled 2017-02-11: qty 30

## 2017-02-11 MED ORDER — SUCCINYLCHOLINE CHLORIDE 20 MG/ML IJ SOLN
INTRAMUSCULAR | Status: AC
  Filled 2017-02-11: qty 1

## 2017-02-11 MED ORDER — MIDAZOLAM HCL 2 MG/2ML IJ SOLN
INTRAMUSCULAR | Status: AC
  Filled 2017-02-11: qty 2

## 2017-02-11 MED ORDER — FENTANYL CITRATE (PF) 100 MCG/2ML IJ SOLN
INTRAMUSCULAR | Status: AC
  Administered 2017-02-11: 25 ug via INTRAVENOUS
  Filled 2017-02-11: qty 2

## 2017-02-11 MED ORDER — FENTANYL CITRATE (PF) 100 MCG/2ML IJ SOLN
INTRAMUSCULAR | Status: AC
  Filled 2017-02-11: qty 2

## 2017-02-11 MED ORDER — OXYCODONE HCL 5 MG/5ML PO SOLN
10.0000 mg | ORAL | Status: DC | PRN
  Administered 2017-02-11: 10 mg via ORAL
  Filled 2017-02-11: qty 10

## 2017-02-11 MED ORDER — ONDANSETRON HCL 4 MG/2ML IJ SOLN
INTRAMUSCULAR | Status: AC
  Filled 2017-02-11: qty 2

## 2017-02-11 MED ORDER — OXYCODONE HCL 5 MG/5ML PO SOLN: 10.0000 mg | ORAL | 0 refills | Status: DC | PRN

## 2017-02-11 MED ORDER — ONDANSETRON HCL 4 MG PO TABS: 4.0000 mg | ORAL_TABLET | Freq: Three times a day (TID) | ORAL | 0 refills | Status: DC | PRN

## 2017-02-11 MED ORDER — LIDOCAINE HCL (PF) 2 % IJ SOLN
INTRAMUSCULAR | Status: AC
  Filled 2017-02-11: qty 10

## 2017-02-11 MED ORDER — OXYCODONE HCL 5 MG/5ML PO SOLN
ORAL | Status: AC
  Administered 2017-02-11: 10 mg via ORAL
  Filled 2017-02-11: qty 5

## 2017-02-11 MED ORDER — BUPIVACAINE HCL 0.5 % IJ SOLN
INTRAMUSCULAR | Status: DC | PRN
  Administered 2017-02-11: 1 mL

## 2017-02-11 MED ORDER — ONDANSETRON HCL 4 MG/2ML IJ SOLN
INTRAMUSCULAR | Status: DC | PRN
  Administered 2017-02-11: 4 mg via INTRAVENOUS

## 2017-02-11 MED ORDER — PROPOFOL 10 MG/ML IV BOLUS
INTRAVENOUS | Status: AC
  Filled 2017-02-11: qty 20

## 2017-02-11 MED ORDER — FENTANYL CITRATE (PF) 100 MCG/2ML IJ SOLN
INTRAMUSCULAR | Status: DC | PRN
  Administered 2017-02-11 (×2): 100 ug via INTRAVENOUS

## 2017-02-11 MED ORDER — PROPOFOL 10 MG/ML IV BOLUS
INTRAVENOUS | Status: DC | PRN
  Administered 2017-02-11: 200 mg via INTRAVENOUS

## 2017-02-11 MED ORDER — SUCCINYLCHOLINE CHLORIDE 20 MG/ML IJ SOLN
INTRAMUSCULAR | Status: DC | PRN
  Administered 2017-02-11: 100 mg via INTRAVENOUS

## 2017-02-11 MED ORDER — DEXAMETHASONE SODIUM PHOSPHATE 10 MG/ML IJ SOLN
INTRAMUSCULAR | Status: DC | PRN
  Administered 2017-02-11: 10 mg via INTRAVENOUS

## 2017-02-11 MED ORDER — OXYMETAZOLINE HCL 0.05 % NA SOLN
NASAL | Status: AC
  Filled 2017-02-11: qty 15

## 2017-02-11 MED ORDER — LIDOCAINE HCL (CARDIAC) 20 MG/ML IV SOLN
INTRAVENOUS | Status: DC | PRN
  Administered 2017-02-11: 100 mg via INTRAVENOUS

## 2017-02-11 MED ORDER — DEXAMETHASONE SODIUM PHOSPHATE 10 MG/ML IJ SOLN
INTRAMUSCULAR | Status: AC
  Filled 2017-02-11: qty 1

## 2017-02-11 MED ORDER — SODIUM CHLORIDE 0.9 % IV SOLN
INTRAVENOUS | Status: DC
  Administered 2017-02-11: 07:00:00 via INTRAVENOUS

## 2017-02-11 MED ORDER — MIDAZOLAM HCL 2 MG/2ML IJ SOLN
INTRAMUSCULAR | Status: DC | PRN
  Administered 2017-02-11: 2 mg via INTRAVENOUS

## 2017-02-11 MED ORDER — FENTANYL CITRATE (PF) 100 MCG/2ML IJ SOLN
25.0000 ug | INTRAMUSCULAR | Status: DC | PRN
  Administered 2017-02-11 (×3): 25 ug via INTRAVENOUS

## 2017-02-11 MED ORDER — OXYCODONE HCL 5 MG/5ML PO SOLN: 5.0000 mg | ORAL | Status: DC | PRN

## 2017-02-11 MED ORDER — LIDOCAINE VISCOUS 2 % MT SOLN: 10.0000 mL | Freq: Four times a day (QID) | OROMUCOSAL | 0 refills | Status: DC | PRN

## 2017-02-11 MED ORDER — PROMETHAZINE HCL 25 MG/ML IJ SOLN: 6.2500 mg | INTRAMUSCULAR | Status: DC | PRN

## 2017-02-11 MED ORDER — FAMOTIDINE 20 MG PO TABS
20.0000 mg | ORAL_TABLET | Freq: Once | ORAL | Status: AC
Start: 2017-02-11 — End: 2017-02-11
  Administered 2017-02-11: 20 mg via ORAL

## 2017-02-11 SURGICAL SUPPLY — 15 items
BLADE BOVIE TIP EXT 4 (BLADE) ×3 IMPLANT
CANISTER SUCT 1200ML W/VALVE (MISCELLANEOUS) ×3 IMPLANT
CATH ROBINSON RED A/P 10FR (CATHETERS) ×3 IMPLANT
CATH ROBINSON RED A/P 12FR (CATHETERS) ×3 IMPLANT
COAG SUCT 10F 3.5MM HAND CTRL (MISCELLANEOUS) ×3 IMPLANT
ELECT REM PT RETURN 9FT ADLT (ELECTROSURGICAL) ×3
ELECTRODE REM PT RTRN 9FT ADLT (ELECTROSURGICAL) ×1 IMPLANT
GLOVE BIO SURGEON STRL SZ7.5 (GLOVE) ×3 IMPLANT
HANDLE SUCTION POOLE (INSTRUMENTS) ×1 IMPLANT
KIT RM TURNOVER STRD PROC AR (KITS) ×3 IMPLANT
NS IRRIG 500ML POUR BTL (IV SOLUTION) ×3 IMPLANT
PACK HEAD/NECK (MISCELLANEOUS) ×3 IMPLANT
SPONGE TONSIL 1 RF SGL (DISPOSABLE) ×3 IMPLANT
SUCTION POOLE HANDLE (INSTRUMENTS) ×3
SYR 3ML LL SCALE MARK (SYRINGE) ×3 IMPLANT

## 2017-02-11 NOTE — Transfer of Care (Signed)
Immediate Anesthesia Transfer of Care Note  Patient: Joan Alexander  Procedure(s) Performed: TONSILLECTOMY (Bilateral )  Patient Location: PACU  Anesthesia Type:General  Level of Consciousness: awake and alert   Airway & Oxygen Therapy: Patient connected to face mask oxygen  Post-op Assessment: Post -op Vital signs reviewed and stable  Post vital signs: stable  Last Vitals:  Vitals:   02/11/17 0700 02/11/17 0936  BP: (!) 174/113 115/78  Pulse: 92 82  Resp: 18 15  Temp: (!) 36.3 C (!) 36 C  SpO2: 100% 98%    Last Pain:  Vitals:   02/11/17 0936  TempSrc: Temporal         Complications: No apparent anesthesia complications

## 2017-02-11 NOTE — Anesthesia Post-op Follow-up Note (Signed)
Anesthesia QCDR form completed.        

## 2017-02-11 NOTE — Anesthesia Procedure Notes (Signed)
Procedure Name: Intubation Date/Time: 02/11/2017 8:40 AM Performed by: Irving BurtonBachich, Nyima Vanacker, CRNA Pre-anesthesia Checklist: Patient identified, Emergency Drugs available, Suction available and Patient being monitored Patient Re-evaluated:Patient Re-evaluated prior to induction Oxygen Delivery Method: Circle system utilized Preoxygenation: Pre-oxygenation with 100% oxygen Induction Type: IV induction Ventilation: Mask ventilation without difficulty Laryngoscope Size: McGraph and 4 Grade View: Grade I Tube type: Oral Rae Tube size: 7.0 mm Number of attempts: 1 Airway Equipment and Method: Stylet Placement Confirmation: ETT inserted through vocal cords under direct vision,  positive ETCO2 and breath sounds checked- equal and bilateral Tube secured with: Tape Dental Injury: Teeth and Oropharynx as per pre-operative assessment

## 2017-02-11 NOTE — Progress Notes (Signed)
Dr Karlton Lemonkarenz aware   Blood pressure 147/100   No new orders

## 2017-02-11 NOTE — Op Note (Signed)
..  02/11/2017  9:13 AM    [name], Joan Alexander  [id number]   Pre-Op Dx:  ENLARGED TONSILS, chronic tonsillitis  Post-op Dx: ENLARGED TONSILS. Chronic tonsillitis  Proc:Tonsillectomy and Adenoidectomy > age 32  Surg: Priscille Shadduck  Anes:  General Endotracheal  EBL:  5ml  Comp:  None  Findings:  3+ tonsils with cryptitis, 2+ partially obstructive  Procedure: After the patient was identified in holding and the history and physical and consent was reviewed, the patient was taken to the operating room and placed in a supine position.  General endotracheal anesthesia was induced in the normal fashion.  At this time, the patient was rotated 45 degrees and a shoulder roll was placed.  At this time, a McIvor mouthgag was inserted into the patient's oral cavity and suspended from the Mayo stand without injury to teeth, lips, or gums.  Next a red rubber catheter was inserted into the patient left nostril for retraction of the uvula and soft palate superiorly.  Next a curved Alice clamp was attached to the patient's right superior tonsillar pole and retracted medially and inferiorly.  A Bovie electrocautery was used to dissect the patient's right tonsil in a subcapsular plane.  Meticulous hemostasis was achieved with Bovie suction cautery.  At this time, the mouth gag was released from suspension for 1 minute.  Attention now was directed to the patient's left side.  In a similar fashion the curved Alice clamp was attached to the superior pole and this was retracted medially and inferiorly and the tonsil was excised in a subcapsular plane with Bovie electrocautery.  After completion of the second tonsil, meticulous hemostasis was continued.  At this time, attention was directed to the patient's Adenoidectomy.  Under indirect visualization using an operating mirror, the adenoid tissue was visualized and noted to be obstructive in nature.  The adenoid tissue was ablated and desiccated with Bovie suction  cautery.  Meticulous hemostasis was continued.  At this time, the patient's nasal cavity and oral cavity was irrigated with sterile saline.  1 ml of 0.05% Marcaine was injected into the posterior and anterior tonsillar pillars.Following this, the care of patient was returned to anesthesia, awakened, and transferred to recovery in stable condition.  Dispo:  PACU to home  Plan: Soft diet.  Limit exercise and strenuous activity for 2 weeks.  Fluid hydration  Recheck my office three weeks.   Kayzlee Wirtanen 9:13 AM 02/11/2017

## 2017-02-11 NOTE — Anesthesia Preprocedure Evaluation (Signed)
Anesthesia Evaluation  Patient identified by MRN, date of birth, ID band Patient awake    Reviewed: Allergy & Precautions, H&P , NPO status , Patient's Chart, lab work & pertinent test results, reviewed documented beta blocker date and time   History of Anesthesia Complications Negative for: history of anesthetic complications  Airway Mallampati: II  TM Distance: >3 FB Neck ROM: full    Dental  (+) Dental Advidsory Given   Pulmonary neg pulmonary ROS,           Cardiovascular Exercise Tolerance: Good hypertension, (-) angina(-) CAD, (-) Past MI, (-) Cardiac Stents and (-) CABG (-) dysrhythmias (-) Valvular Problems/Murmurs     Neuro/Psych negative neurological ROS  negative psych ROS   GI/Hepatic negative GI ROS, Neg liver ROS,   Endo/Other  diabetes  Renal/GU negative Renal ROS  negative genitourinary   Musculoskeletal   Abdominal   Peds  Hematology negative hematology ROS (+)   Anesthesia Other Findings Past Medical History: No date: Diabetes mellitus without complication (HCC) No date: Gallstones No date: Hypertension   Reproductive/Obstetrics negative OB ROS                             Anesthesia Physical Anesthesia Plan  ASA: II  Anesthesia Plan: General   Post-op Pain Management:    Induction: Intravenous  PONV Risk Score and Plan: 3 and Ondansetron and Dexamethasone  Airway Management Planned: Oral ETT  Additional Equipment:   Intra-op Plan:   Post-operative Plan: Extubation in OR  Informed Consent: I have reviewed the patients History and Physical, chart, labs and discussed the procedure including the risks, benefits and alternatives for the proposed anesthesia with the patient or authorized representative who has indicated his/her understanding and acceptance.   Dental Advisory Given  Plan Discussed with: Anesthesiologist, CRNA and Surgeon  Anesthesia Plan  Comments:         Anesthesia Quick Evaluation

## 2017-02-11 NOTE — H&P (Signed)
..  History and Physical paper copy reviewed and updated date of procedure and will be scanned into system.  Patient seen and examined.  

## 2017-02-11 NOTE — Progress Notes (Signed)
Blood pressure 146/113  Dr Ballard Russellkarnez aware  Tell pt to take blood pressure when she gets home

## 2017-02-11 NOTE — Discharge Instructions (Signed)

## 2017-02-11 NOTE — Anesthesia Postprocedure Evaluation (Signed)
Anesthesia Post Note  Patient: Joan Alexander  Procedure(s) Performed: TONSILLECTOMY (Bilateral )  Patient location during evaluation: PACU Anesthesia Type: General Level of consciousness: awake and alert Pain management: pain level controlled Vital Signs Assessment: post-procedure vital signs reviewed and stable Respiratory status: spontaneous breathing, nonlabored ventilation, respiratory function stable and patient connected to nasal cannula oxygen Cardiovascular status: blood pressure returned to baseline and stable Postop Assessment: no apparent nausea or vomiting Anesthetic complications: no     Last Vitals:  Vitals:   02/11/17 1041 02/11/17 1134  BP: (!) 155/102 (!) 154/100  Pulse: 69 80  Resp: 16   Temp: (!) 36.1 C   SpO2: 98% 99%    Last Pain:  Vitals:   02/11/17 1134  TempSrc:   PainSc: 3                  Lenard SimmerAndrew Cristhian Vanhook

## 2017-02-15 LAB — SURGICAL PATHOLOGY

## 2017-03-29 ENCOUNTER — Encounter: Payer: Self-pay | Admitting: Emergency Medicine

## 2017-03-29 ENCOUNTER — Other Ambulatory Visit: Payer: Self-pay

## 2017-03-29 ENCOUNTER — Emergency Department: Payer: Medicaid Other

## 2017-03-29 ENCOUNTER — Emergency Department
Admission: EM | Admit: 2017-03-29 | Discharge: 2017-03-29 | Disposition: A | Discharge status: Home / Self Care | Payer: Medicaid Other | Attending: Emergency Medicine | Admitting: Emergency Medicine

## 2017-03-29 DIAGNOSIS — R0789 Other chest pain: Secondary | ICD-10-CM | POA: Diagnosis not present

## 2017-03-29 DIAGNOSIS — Z7984 Long term (current) use of oral hypoglycemic drugs: Secondary | ICD-10-CM | POA: Diagnosis not present

## 2017-03-29 DIAGNOSIS — R197 Diarrhea, unspecified: Secondary | ICD-10-CM | POA: Diagnosis not present

## 2017-03-29 DIAGNOSIS — R112 Nausea with vomiting, unspecified: Secondary | ICD-10-CM | POA: Insufficient documentation

## 2017-03-29 DIAGNOSIS — E119 Type 2 diabetes mellitus without complications: Secondary | ICD-10-CM | POA: Diagnosis not present

## 2017-03-29 DIAGNOSIS — I10 Essential (primary) hypertension: Secondary | ICD-10-CM | POA: Insufficient documentation

## 2017-03-29 LAB — COMPREHENSIVE METABOLIC PANEL
ALT: 19 U/L (ref 14–54)
AST: 23 U/L (ref 15–41)
Albumin: 4.5 g/dL (ref 3.5–5.0)
Alkaline Phosphatase: 51 U/L (ref 38–126)
Anion gap: 12 (ref 5–15)
BILIRUBIN TOTAL: 0.7 mg/dL (ref 0.3–1.2)
BUN: 15 mg/dL (ref 6–20)
CHLORIDE: 101 mmol/L (ref 101–111)
CO2: 23 mmol/L (ref 22–32)
Calcium: 8.9 mg/dL (ref 8.9–10.3)
Creatinine, Ser: 0.55 mg/dL (ref 0.44–1.00)
Glucose, Bld: 127 mg/dL — ABNORMAL HIGH (ref 65–99)
POTASSIUM: 3.6 mmol/L (ref 3.5–5.1)
Sodium: 136 mmol/L (ref 135–145)
TOTAL PROTEIN: 8.7 g/dL — AB (ref 6.5–8.1)

## 2017-03-29 LAB — CBC
HCT: 42.5 % (ref 35.0–47.0)
Hemoglobin: 14.3 g/dL (ref 12.0–16.0)
MCH: 27.3 pg (ref 26.0–34.0)
MCHC: 33.6 g/dL (ref 32.0–36.0)
MCV: 81.3 fL (ref 80.0–100.0)
PLATELETS: 264 10*3/uL (ref 150–440)
RBC: 5.23 MIL/uL — AB (ref 3.80–5.20)
RDW: 14 % (ref 11.5–14.5)
WBC: 10.4 10*3/uL (ref 3.6–11.0)

## 2017-03-29 LAB — TROPONIN I

## 2017-03-29 LAB — LIPASE, BLOOD: LIPASE: 32 U/L (ref 11–51)

## 2017-03-29 MED ORDER — SODIUM CHLORIDE 0.9 % IV BOLUS (SEPSIS)
1000.0000 mL | Freq: Once | INTRAVENOUS | Status: AC
  Administered 2017-03-29: 1000 mL via INTRAVENOUS

## 2017-03-29 MED ORDER — ONDANSETRON HCL 4 MG/2ML IJ SOLN
4.0000 mg | Freq: Once | INTRAMUSCULAR | Status: AC
  Administered 2017-03-29: 4 mg via INTRAVENOUS
  Filled 2017-03-29: qty 2

## 2017-03-29 MED ORDER — ONDANSETRON HCL 4 MG PO TABS: 4.0000 mg | ORAL_TABLET | Freq: Three times a day (TID) | ORAL | 0 refills | Status: DC | PRN

## 2017-03-29 NOTE — ED Provider Notes (Addendum)
[facility] Emergency Department Provider Note  ____________________________________________   I have reviewed the triage vital signs and the nursing notes. Where available I have reviewed prior notes and, if possible and indicated, outside hospital notes.    HISTORY  Chief Complaint Emesis and Chest Pain    HPI Joan Alexander is a 32 y.o. female 3 diabetes mellitus, hypertension gallstones, she has had nausea and vomiting this morning and, upon arrival to the emergency room, diarrhea.  She denies any fever or chills.  She denies any hematemesis or bright red blood per rectum.  She also states that she has had left chest wall some "soreness" for the last week or so.  Worse when she touches it or changes position.  Nonpleuritic, no personal family history of PE or DVT no leg swelling no recent travel not on birth control and states adamantly that she has not been sexually active since last May and cannot be pregnant.  She denies any abdominal pain.  She just has had vomiting and diarrhea since 4:00 this morning and while she is here she wanted also to have her left-sided chest wall pain checked out.  Patient has therefore been symptomatic for the chest wall pain for 1 week and the nausea and vomiting and diarrhea which is her real chief complaint for less than 7 hours.  When I asked her what the pain is on her chest she points to the area around the left breast, just medially intact, and states "in this spot right here".     Past Medical History:  Diagnosis Date  . Diabetes mellitus without complication (HCC)   . Gallstones   . Hypertension     There are no active problems to display for this patient.   Past Surgical History:  Procedure Laterality Date  . TONSILLECTOMY AND ADENOIDECTOMY Bilateral 02/11/2017   Procedure: TONSILLECTOMY;  Surgeon: Bud FaceVaught, Creighton, MD;  Location: [facility] ORS;  Service: ENT;  Laterality: Bilateral;    Prior to Admission medications    Medication Sig Start Date End Date Taking? Authorizing Provider  amLODipine (NORVASC) 5 MG tablet Take 5 mg by mouth daily.    [provider]  hydrochlorothiazide (HYDRODIURIL) 25 MG tablet Take 25 mg by mouth daily. 01/26/17   [provider]  lidocaine (XYLOCAINE) 2 % solution Use as directed 10 mLs in the mouth or throat every 6 (six) hours as needed for mouth pain (Swish and spit). 02/11/17   Bud FaceVaught, Creighton, MD  metFORMIN (GLUCOPHAGE-XR) 500 MG 24 hr tablet Take 1,000 mg by mouth daily with breakfast.    [provider]  ondansetron (ZOFRAN) 4 MG tablet Take 1 tablet (4 mg total) by mouth every 8 (eight) hours as needed for up to 10 doses for nausea or vomiting. 02/11/17   Bud FaceVaught, Creighton, MD  oxyCODONE (ROXICODONE) 5 MG/5ML solution Take 10 mLs (10 mg total) by mouth every 4 (four) hours as needed for severe pain. 02/11/17   Bud FaceVaught, Creighton, MD    Allergies Lisinopril  No family history on file.  Social History Social History   Tobacco Use  . Smoking status: Never Smoker  . Smokeless tobacco: Never Used  Substance Use Topics  . Alcohol use: No  . Drug use: No    Review of Systems Constitutional: No fever/chills Eyes: No visual changes. ENT: No sore throat. No stiff neck no neck pain Cardiovascular: The HPI Respiratory: Denies shortness of breath. Gastrointestinal:   See history of present illness genitourinary: Negative for dysuria.  Musculoskeletal: Negative lower extremity swelling Skin: Negative for rash. Neurological: Negative for severe headaches, focal weakness or numbness.   ____________________________________________   PHYSICAL EXAM:  VITAL SIGNS: ED Triage Vitals [03/29/17 0956]  Enc Vitals Group     BP      Pulse      Resp      Temp      Temp src      SpO2      Weight 207 lb (93.9 kg)     Height 5\' 5"  (1.651 m)     Head Circumference      Peak Flow      Pain Score 7     Pain Loc      Pain Edu?      Excl. in GC?      Constitutional: Alert and oriented. Well appearing and in no acute distress. Eyes: Conjunctivae are normal Head: Atraumatic HEENT: No congestion/rhinnorhea. Mucous membranes are moist.  Oropharynx non-erythematous Neck:   Nontender with no meningismus, no masses, no stridor Cardiovascular: Normal rate, regular rhythm. Grossly normal heart sounds.  Good peripheral circulation. Respiratory: Normal respiratory effort.  No retractions. Lungs CTAB. Chest: Female chaperone present there is reversible chest wall pain with no bruising mass or lesion, there is no crepitus there is no flail chest there are no shingles, I touch this area patient states "ouch that the pain right there" and pulls back. Abdominal: Soft and nontender. No distention. No guarding no rebound Back:  There is no focal tenderness or step off.  there is no midline tenderness there are no lesions noted. there is no CVA tenderness Musculoskeletal: No lower extremity tenderness, no upper extremity tenderness. No joint effusions, no DVT signs strong distal pulses no edema Neurologic:  Normal speech and language. No gross focal neurologic deficits are appreciated.  Skin:  Skin is warm, dry and intact. No rash noted. Psychiatric: Mood and affect are normal. Speech and behavior are normal.  ____________________________________________   LABS (all labs ordered are listed, but only abnormal results are displayed)  Labs Reviewed  CBC - Abnormal; Notable for the following components:      Result Value   RBC 5.23 (*)    All other components within normal limits  TROPONIN I  COMPREHENSIVE METABOLIC PANEL  LIPASE, BLOOD    Pertinent labs  results that were available during my care of the patient were reviewed by me and considered in my medical decision making (see chart for details). ____________________________________________  EKG  I personally interpreted any EKGs ordered by me or triage ] Sinus rate 102 no acute ST  elevation or depression, LAD noted, borderline left bundle branch block, no acute ischemic changes noted ____________________________________________  RADIOLOGY  Pertinent labs & imaging results that were available during my care of the patient were reviewed by me and considered in my medical decision making (see chart for details). If possible, patient and/or family made aware of any abnormal findings.  Dg Chest 2 View  Result Date: 03/29/2017 CLINICAL DATA:  Nausea and vomiting since 4 a.m. this morning. Chest pain for 1 week. EXAM: CHEST - 2 VIEW COMPARISON:  08/29/2015 FINDINGS: The cardiac silhouette, mediastinal and hilar contours are within normal limits and stable. The lungs are clear. No pleural effusion. No worrisome pulmonary lesions. The bony thorax is intact. IMPRESSION: Normal chest x-ray. Electronically Signed   By: Rudie Meyer M.D.   On: 03/29/2017 10:36   ____________________________________________    PROCEDURES  Procedure(s) performed: None  Procedures  Critical Care performed: None  ____________________________________________   INITIAL IMPRESSION / ASSESSMENT AND PLAN / ED COURSE  Pertinent labs & imaging results that were available during my care of the patient were reviewed by me and considered in my medical decision making (see chart for details).   Personally reviewed x-ray, patient here for 2 different reasons really the main complaint is her nausea vomiting diarrhea for a few hours.  Very strong community burden of same multiple sick contacts the patient personally has with same, abdomen is completely benign, we will give her IV fluids although not really apparent that she is markedly dehydrated.  She is requesting ice chips which I am happy to give her.  Second complaint is reproducible chest wall pain.  Very low suspicion for ACS PE or dissection.  At this time, there does not appear to be clinical evidence to support the diagnosis of pulmonary embolus,  dissection, myocarditis, endocarditis, pericarditis, pericardial tamponade, acute coronary syndrome, pneumothorax, pneumonia, or any other acute intrathoracic pathology that will require admission or acute intervention. Nor is there evidence of any significant intra-abdominal pathology causing this discomfort.  ----------------------------------------- 11:42 AM on 03/29/2017 -----------------------------------------  She has not had vomiting or diarrhea since she has been here she is tolerating p.o. with no difficulty serial abdominal exams are benign.  Considering the patient's symptoms, medical history, and physical examination today, I have low suspicion for cholecystitis or biliary pathology, pancreatitis, perforation or bowel obstruction, hernia, intra-abdominal abscess,, TOA PID, ectopic pregnancy, AAA or dissection, volvulus or intussusception, mesenteric ischemia, ischemic gut, pyelonephritis or appendicitis.  Patient eager to go home we will discharge with close outpatient follow-up and return precautions    ____________________________________________   FINAL CLINICAL IMPRESSION(S) / ED DIAGNOSES  Final diagnoses:  None      This chart was dictated using voice recognition software.  Despite best efforts to proofread,  errors can occur which can change meaning.      Jeanmarie Plant, MD 03/29/17 1103    Jeanmarie Plant, MD 03/29/17 [phone]

## 2017-03-29 NOTE — Discharge Instructions (Signed)
Return to the emergency room for any new or worrisome symptoms including increased or worsening or change in your pain, abdominal pain, vomiting, fever or if you feel worse in any way

## 2017-03-29 NOTE — ED Notes (Signed)
Patient ambulatory to lobby with NAD noted. Verbalized understanding of discharge instructions and follow-up care. 

## 2017-03-29 NOTE — ED Triage Notes (Signed)
States this am woke about 4am with nausea and vomiting. States has had L chest pain x 1 week.

## 2017-06-30 ENCOUNTER — Emergency Department: Payer: Medicaid Other

## 2017-06-30 ENCOUNTER — Emergency Department
Admission: EM | Admit: 2017-06-30 | Discharge: 2017-06-30 | Disposition: A | Discharge status: Home / Self Care | Payer: Medicaid Other | Attending: Emergency Medicine | Admitting: Emergency Medicine

## 2017-06-30 ENCOUNTER — Other Ambulatory Visit: Payer: Self-pay

## 2017-06-30 DIAGNOSIS — I517 Cardiomegaly: Secondary | ICD-10-CM | POA: Diagnosis not present

## 2017-06-30 DIAGNOSIS — Z7984 Long term (current) use of oral hypoglycemic drugs: Secondary | ICD-10-CM | POA: Insufficient documentation

## 2017-06-30 DIAGNOSIS — I10 Essential (primary) hypertension: Secondary | ICD-10-CM | POA: Diagnosis not present

## 2017-06-30 DIAGNOSIS — K8050 Calculus of bile duct without cholangitis or cholecystitis without obstruction: Secondary | ICD-10-CM | POA: Diagnosis not present

## 2017-06-30 DIAGNOSIS — E119 Type 2 diabetes mellitus without complications: Secondary | ICD-10-CM | POA: Diagnosis not present

## 2017-06-30 DIAGNOSIS — Z79899 Other long term (current) drug therapy: Secondary | ICD-10-CM | POA: Insufficient documentation

## 2017-06-30 DIAGNOSIS — R1011 Right upper quadrant pain: Secondary | ICD-10-CM | POA: Diagnosis present

## 2017-06-30 LAB — CBC
HEMATOCRIT: 38.1 % (ref 35.0–47.0)
Hemoglobin: 12.9 g/dL (ref 12.0–16.0)
MCH: 27.5 pg (ref 26.0–34.0)
MCHC: 33.8 g/dL (ref 32.0–36.0)
MCV: 81.4 fL (ref 80.0–100.0)
Platelets: 224 10*3/uL (ref 150–440)
RBC: 4.69 MIL/uL (ref 3.80–5.20)
RDW: 14.4 % (ref 11.5–14.5)
WBC: 6.4 10*3/uL (ref 3.6–11.0)

## 2017-06-30 LAB — COMPREHENSIVE METABOLIC PANEL
ALT: 15 U/L (ref 14–54)
ANION GAP: 6 (ref 5–15)
AST: 16 U/L (ref 15–41)
Albumin: 4.1 g/dL (ref 3.5–5.0)
Alkaline Phosphatase: 46 U/L (ref 38–126)
BUN: 7 mg/dL (ref 6–20)
CHLORIDE: 104 mmol/L (ref 101–111)
CO2: 25 mmol/L (ref 22–32)
Calcium: 8.5 mg/dL — ABNORMAL LOW (ref 8.9–10.3)
Creatinine, Ser: 0.6 mg/dL (ref 0.44–1.00)
Glucose, Bld: 107 mg/dL — ABNORMAL HIGH (ref 65–99)
POTASSIUM: 3.8 mmol/L (ref 3.5–5.1)
Sodium: 135 mmol/L (ref 135–145)
Total Bilirubin: 0.5 mg/dL (ref 0.3–1.2)
Total Protein: 7.3 g/dL (ref 6.5–8.1)

## 2017-06-30 LAB — URINALYSIS, COMPLETE (UACMP) WITH MICROSCOPIC
BACTERIA UA: NONE SEEN
BILIRUBIN URINE: NEGATIVE
Glucose, UA: NEGATIVE mg/dL
HGB URINE DIPSTICK: NEGATIVE
KETONES UR: NEGATIVE mg/dL
LEUKOCYTES UA: NEGATIVE
Nitrite: NEGATIVE
PH: 6 (ref 5.0–8.0)
Protein, ur: NEGATIVE mg/dL
Specific Gravity, Urine: 1.014 (ref 1.005–1.030)

## 2017-06-30 LAB — POCT PREGNANCY, URINE: Preg Test, Ur: NEGATIVE

## 2017-06-30 LAB — LIPASE, BLOOD: Lipase: 36 U/L (ref 11–51)

## 2017-06-30 MED ORDER — HYDROCODONE-ACETAMINOPHEN 5-325 MG PO TABS: 1.0000 | ORAL_TABLET | ORAL | 0 refills | Status: DC | PRN

## 2017-06-30 NOTE — ED Triage Notes (Signed)
Pt arrived via POV with reports of abdominal pain and nausea. Pt states she has hx of gallstones and states she thinks her gallbladder is inflamed.  Pt denies any vomiting or diarrhea.  Pt c/o RUQ pain radiating to right shoulder.

## 2017-06-30 NOTE — ED Notes (Signed)
First Nurse Note:  Patient complaining of abdominal pian and nausea, states "I think it's my gall bladder".  Alert and oriented, color good.

## 2017-06-30 NOTE — ED Provider Notes (Addendum)
[facility] Emergency Department Provider Note   ____________________________________________    I have reviewed the triage vital signs and the nursing notes.   HISTORY  Chief Complaint Nausea and Abdominal Pain     HPI Joan Alexander is a 32 y.o. female who presents with complaints of nausea and abdominal pain.  Patient reports intermittent abdominal pain typically after eating in her right upper quadrant primarily.  She knows she has gallstones.  Currently she reports the pain has improved significantly.  She came to the emergency department to make sure that a gallstone had "gotten somewhere was not supposed to ".  She does not have any plans for surgery at this point.  Does have a history of diabetes.  Also reports over the last several months intermittent mild shortness of breath, no chest pain.  Past Medical History:  Diagnosis Date  . Diabetes mellitus without complication (HCC)   . Gallstones   . Hypertension     There are no active problems to display for this patient.   Past Surgical History:  Procedure Laterality Date  . TONSILLECTOMY AND ADENOIDECTOMY Bilateral 02/11/2017   Procedure: TONSILLECTOMY;  Surgeon: Bud Face, MD;  Location: [facility] ORS;  Service: ENT;  Laterality: Bilateral;    Prior to Admission medications   Medication Sig Start Date End Date Taking? Authorizing Provider  amLODipine (NORVASC) 5 MG tablet Take 5 mg by mouth daily.   Yes [provider]  hydrochlorothiazide (HYDRODIURIL) 25 MG tablet Take 25 mg by mouth daily. 01/26/17  Yes [provider]  metFORMIN (GLUCOPHAGE-XR) 500 MG 24 hr tablet Take 1,000 mg by mouth daily with breakfast.   Yes [provider]  montelukast (SINGULAIR) 10 MG tablet Take 10 mg by mouth daily. 06/25/17  Yes [provider]  PROAIR HFA 108 (90 Base) MCG/ACT inhaler Inhale 2 puffs by mouth every 4 to 6 hours as needed for wheezing 05/12/17  Yes  [provider]  HYDROcodone-acetaminophen (NORCO/VICODIN) 5-325 MG tablet Take 1 tablet by mouth every 4 (four) hours as needed for moderate pain. 06/30/17   Jene Every, MD  lidocaine (XYLOCAINE) 2 % solution Use as directed 10 mLs in the mouth or throat every 6 (six) hours as needed for mouth pain (Swish and spit). Patient not taking: Reported on 06/30/2017 02/11/17   Bud Face, MD  ondansetron (ZOFRAN) 4 MG tablet Take 1 tablet (4 mg total) by mouth every 8 (eight) hours as needed for up to 10 doses for nausea or vomiting. Patient not taking: Reported on 06/30/2017 02/11/17   Bud Face, MD  ondansetron (ZOFRAN) 4 MG tablet Take 1 tablet (4 mg total) by mouth every 8 (eight) hours as needed for nausea or vomiting. Patient not taking: Reported on 06/30/2017 03/29/17   Jeanmarie Plant, MD  oxyCODONE (ROXICODONE) 5 MG/5ML solution Take 10 mLs (10 mg total) by mouth every 4 (four) hours as needed for severe pain. Patient not taking: Reported on 06/30/2017 02/11/17   Bud Face, MD     Allergies Lisinopril  No family history on file.  Social History Social History   Tobacco Use  . Smoking status: Never Smoker  . Smokeless tobacco: Never Used  Substance Use Topics  . Alcohol use: No  . Drug use: No    Review of Systems  Constitutional: No fever/chills Eyes: No visual changes.  ENT: No sore throat. Cardiovascular: Denies chest pain. Respiratory: Denies shortness of breath. Gastrointestinal: As above Genitourinary: Negative for dysuria. Musculoskeletal:  Negative for back pain. Skin: Negative for rash. Neurological: Negative for headaches    ____________________________________________   PHYSICAL EXAM:  VITAL SIGNS: ED Triage Vitals  Enc Vitals Group     BP 06/30/17 0840 (!) 160/102     Pulse Rate 06/30/17 0840 70     Resp 06/30/17 0840 18     Temp --      Temp Source 06/30/17 0840 Oral     SpO2 06/30/17 0840 99 %     Weight 06/30/17 0848  97.5 kg (215 lb)     Height 06/30/17 0848 1.6 m (5\' 3" )     Head Circumference --      Peak Flow --      Pain Score 06/30/17 0847 7     Pain Loc --      Pain Edu? --      Excl. in GC? --     Constitutional: Alert and oriented. No acute distress. Pleasant and interactive Eyes: Conjunctivae are normal.   Nose: No congestion/rhinnorhea. Mouth/Throat: Mucous membranes are moist.   Cardiovascular: Normal rate, regular rhythm. Grossly normal heart sounds.  Good peripheral circulation. Respiratory: Normal respiratory effort.  No retractions. Lungs CTAB. Gastrointestinal: Soft and nontender. No distention.  No CVA tenderness.  Musculoskeletal: No lower extremity tenderness nor edema.  Warm and well perfused Neurologic:  Normal speech and language. No gross focal neurologic deficits are appreciated.  Skin:  Skin is warm, dry and intact. No rash noted. Psychiatric: Mood and affect are normal. Speech and behavior are normal.  ____________________________________________   LABS (all labs ordered are listed, but only abnormal results are displayed)  Labs Reviewed  COMPREHENSIVE METABOLIC PANEL - Abnormal; Notable for the following components:      Result Value   Glucose, Bld 107 (*)    Calcium 8.5 (*)    All other components within normal limits  URINALYSIS, COMPLETE (UACMP) WITH MICROSCOPIC - Abnormal; Notable for the following components:   Color, Urine YELLOW (*)    APPearance HAZY (*)    All other components within normal limits  LIPASE, BLOOD  CBC  POC URINE PREG, ED  POCT PREGNANCY, URINE   ____________________________________________  EKG  ED ECG REPORT I, Jene Every, the attending physician, personally viewed and interpreted this ECG.  Date: 06/30/2017  Rhythm: normal sinus rhythm QRS Axis: normal Intervals: normal ST/T Wave abnormalities: normal Narrative Interpretation: no evidence of acute  ischemia  ____________________________________________  RADIOLOGY  Chest x-ray shows mild cardiomegaly, discussed with patient need for outpatient follow-up ____________________________________________   PROCEDURES  Procedure(s) performed: No  Procedures   Critical Care performed:No ____________________________________________   INITIAL IMPRESSION / ASSESSMENT AND PLAN / ED COURSE  Pertinent labs & imaging results that were available during my care of the patient were reviewed by me and considered in my medical decision making (see chart for details).  Patient well-appearing in no acute distress.  Abdominal pain has resolved.  Exam is quite reassuring.  Lab work unremarkable.  Discussed chest x-ray cardiomegaly with the patient the need for close blood pressure control she did miss a dose of blood pressure medications yesterday.  Outpatient follow-up with PCP for cardiomegaly and blood pressure  Will refer to surgery for likely biliary colic    ____________________________________________   FINAL CLINICAL IMPRESSION(S) / ED DIAGNOSES  Final diagnoses:  Biliary colic  Cardiomegaly        Note:  This document was prepared using Dragon voice recognition software and may include unintentional dictation errors.  Jene EveryKinner, Miyoshi Ligas, MD 06/30/17 1246    Jene EveryKinner, Jhoan Schmieder, MD 06/30/17 1246

## 2017-06-30 NOTE — ED Notes (Signed)
ED Provider at bedside. 

## 2017-06-30 NOTE — ED Notes (Signed)
Pt alert and oriented X4, active, cooperative, pt in NAD. RR even and unlabored, color WNL.  Pt informed to return if any life threatening symptoms occur.  Discharge and followup instructions reviewed.  

## 2017-07-14 ENCOUNTER — Ambulatory Visit: Payer: Medicaid Other | Admitting: Surgery

## 2017-07-14 ENCOUNTER — Encounter: Payer: Self-pay | Admitting: Surgery

## 2017-07-14 VITALS — BP 150/100 | HR 82 | Temp 98.3°F | Wt 214.0 lb

## 2017-07-14 DIAGNOSIS — R1011 Right upper quadrant pain: Secondary | ICD-10-CM | POA: Diagnosis not present

## 2017-07-14 DIAGNOSIS — K8020 Calculus of gallbladder without cholecystitis without obstruction: Secondary | ICD-10-CM | POA: Insufficient documentation

## 2017-07-14 NOTE — H&P (View-Only) (Signed)
Surgical Clinic History and Physical  Referring provider:  [facility] [address] [place], [place] [address]  HISTORY OF PRESENT ILLNESS (HPI):  32 y.o. female presents for evaluation of abdominal pain. Patient reports she has been experiencing pain after eating for at least 4 or 5 years, but she describes that while it used to be occasional and only after particularly fatty fried or greasy foods, it has more recently become after everything she eats "except water". Upon further questioning, however, patient clarifies that "everything" she eats includes meat and/or cheese. While she was previously advised she has gallstones, to avoid fatty foods, and to follow up with a surgeon to consider cholecystectomy, she has not and 2 weeks ago presented [facility] with abdominal pain and concern that a "gallstone had gotten" stuck "somewhere it was not supposed to be". Though labs were checked, only a CXR was performed, patient's pain improved, and surgical follow-up was again advised. Patient otherwise denies any fever/chills, though she admits to occasional CP and SOB and was recently told she has an enlarged heart and has a cardiology appointment 7/19.  PAST MEDICAL HISTORY (PMH):  Past Medical History:  Diagnosis Date  . Diabetes mellitus without complication (HCC)   . Gallstones   . Hypertension      PAST SURGICAL HISTORY (PSH):  Past Surgical History:  Procedure Laterality Date  . TONSILLECTOMY AND ADENOIDECTOMY Bilateral 02/11/2017   Procedure: TONSILLECTOMY;  Surgeon: Bud FaceVaught, Creighton, MD;  Location: [facility] ORS;  Service: ENT;  Laterality: Bilateral;     MEDICATIONS:  Prior to Admission medications   Medication Sig Start Date End Date Taking? Authorizing Provider  amLODipine (NORVASC) 5 MG tablet Take 5 mg by mouth daily.   Yes [provider]  hydrochlorothiazide (HYDRODIURIL) 25 MG tablet Take 25 mg by mouth daily. 01/26/17  Yes  [provider]  HYDROcodone-acetaminophen (NORCO/VICODIN) 5-325 MG tablet Take 1 tablet by mouth every 4 (four) hours as needed for moderate pain. 06/30/17  Yes Jene EveryKinner, Robert, MD  metFORMIN (GLUCOPHAGE-XR) 500 MG 24 hr tablet Take 1,000 mg by mouth daily with breakfast.   Yes [provider]  montelukast (SINGULAIR) 10 MG tablet Take 10 mg by mouth daily. 06/25/17  Yes [provider]  PROAIR HFA 108 (90 Base) MCG/ACT inhaler Inhale 2 puffs by mouth every 4 to 6 hours as needed for wheezing 05/12/17  Yes [provider]     ALLERGIES:  Allergies  Allergen Reactions  . Lisinopril Itching     SOCIAL HISTORY:  Social History   Socioeconomic History  . Marital status: Single    Spouse name: Not on file  . Number of children: Not on file  . Years of education: Not on file  . Highest education level: Not on file  Occupational History  . Not on file  Social Needs  . Financial resource strain: Not on file  . Food insecurity:    Worry: Not on file    Inability: Not on file  . Transportation needs:    Medical: Not on file    Non-medical: Not on file  Tobacco Use  . Smoking status: Never Smoker  . Smokeless tobacco: Never Used  Substance and Sexual Activity  . Alcohol use: No  . Drug use: No  . Sexual activity: Not on file  Lifestyle  . Physical activity:    Days per week: Not on file    Minutes per session: Not on file  .  Stress: Not on file  Relationships  . Social connections:    Talks on phone: Not on file    Gets together: Not on file    Attends religious service: Not on file    Active member of club or organization: Not on file    Attends meetings of clubs or organizations: Not on file    Relationship status: Not on file  . Intimate partner violence:    Fear of current or ex partner: Not on file    Emotionally abused: Not on file    Physically abused: Not on file    Forced sexual activity: Not on file  Other Topics Concern  . Not  on file  Social History Narrative  . Not on file    The patient currently resides (home / rehab facility / nursing home): Home The patient normally is (ambulatory / bedbound): Ambulatory  FAMILY HISTORY:  History reviewed. No pertinent family history.  Otherwise negative/non-contributory.  REVIEW OF SYSTEMS:  Constitutional: denies any other weight loss, fever, chills, or sweats  Eyes: denies any other vision changes, history of eye injury  ENT: denies sore throat, hearing problems  Respiratory: shortness of breath as per HPI, denies wheezing  Cardiovascular: chest pain as per HPI, denies palpitations  Gastrointestinal: abdominal pain, N/V, and bowel function as per HPI Musculoskeletal: denies any other joint pains or cramps  Skin: Denies any other rashes or skin discolorations Neurological: denies any other headache, dizziness, weakness  Psychiatric: Denies any other depression, anxiety   All other review of systems were otherwise negative   VITAL SIGNS:  BP (!) 150/100   Pulse 82   Temp 98.3 F (36.8 C) (Oral)   Wt 214 lb (97.1 kg)   BMI 37.91 kg/m   PHYSICAL EXAM:  Constitutional:  -- Obese body habitus  -- Awake, alert, and oriented x3  Eyes:  -- Pupils equally round and reactive to light  -- No scleral icterus  Ear, nose, throat:  -- No jugular venous distension -- No nasal drainage, bleeding Pulmonary:  -- No crackles  -- Equal breath sounds bilaterally -- Breathing non-labored at rest Cardiovascular:  -- S1, S2 present  -- No pericardial rubs  Gastrointestinal:  -- Abdomen soft and non-distended with minimal RUQ tenderness to palpation, no guarding/rebound  -- No abdominal masses appreciated, pulsatile or otherwise  Musculoskeletal and Integumentary:  -- Wounds or skin discoloration: None appreciated -- Extremities: B/L UE and LE FROM, hands and feet warm, no edema  Neurologic:  -- Motor function: Intact and symmetric -- Sensation: Intact and  symmetric  Labs:  CBC Latest Ref Rng & Units 06/30/2017 03/29/2017 02/11/2017  WBC 3.6 - 11.0 K/uL 6.4 10.4 -  Hemoglobin 12.0 - 16.0 g/dL 16.1 09.6 04.5  Hematocrit 35.0 - 47.0 % 38.1 42.5 41.0  Platelets 150 - 440 K/uL 224 264 -   CMP Latest Ref Rng & Units 06/30/2017 03/29/2017 02/11/2017  Glucose 65 - 99 mg/dL 409(W) 119(J) 478(G)  BUN 6 - 20 mg/dL 7 15 -  Creatinine 9.56 - 1.00 mg/dL 2.13 0.86 -  Sodium 578 - 145 mmol/L 135 136 140  Potassium 3.5 - 5.1 mmol/L 3.8 3.6 3.9  Chloride 101 - 111 mmol/L 104 101 -  CO2 22 - 32 mmol/L 25 23 -  Calcium 8.9 - 10.3 mg/dL 4.6(N) 8.9 -  Total Protein 6.5 - 8.1 g/dL 7.3 6.2(X) -  Total Bilirubin 0.3 - 1.2 mg/dL 0.5 0.7 -  Alkaline Phos 38 - 126 U/L  46 51 -  AST 15 - 41 U/L 16 23 -  ALT 14 - 54 U/L 15 19 -    Imaging studies:  Limited RUQ Abdominal Ultrasound (08/29/2015) Shadowing gallstones are present. The largest measures 9.6 mm. Wall thickness is within normal limits at 2.3 mm. There  is no sonographic Murphy's sign. Common bile duct diameter measures 3.8 mm, which is within normal limits.  Assessment/Plan: (ICD-10's: K80.00) 32 y.o. female with symptomatic cholelithiasis, complicated by pertinent comorbidities including morbid obesity (BMI 38), DM, HTN, asthma, and recently diagnosed cardiomegaly.              - avoid/minimize foods with higher fat content (meats, cheeses/dairy, and fried)             - prefer low-fat vegetables, whole grains (wheat bread, ceareals, etc), and fruits until cholecystectomy              - all risks, benefits, and alternatives to cholecystectomy were discussed with the patient, all of his questions were answered to his expressed satisfaction, patient expresses he wishes to proceed, and informed consent was obtained.             - will plan for laparoscopic cholecystectomy towards the end of July pending cardiology evaluation and OR availability             - anticipate return to clinic 2 weeks after above  planned surgery             - instructed to call if any questions or concerns  All of the above recommendations were discussed with the patient and patient's family, and all of patient's and family's questions were answered to their expressed satisfaction.  Thank you for the opportunity to participate in this patient's care.  -- Scherrie Gerlach Earlene Plater, MD, RPVI : [facility] General Surgery - Partnering for exceptional care. Office: [phone]

## 2017-07-14 NOTE — Patient Instructions (Addendum)

## 2017-07-14 NOTE — Progress Notes (Signed)
Surgical Clinic History and Physical  Referring provider:  [facility] [address] [place], [place] [address]  HISTORY OF PRESENT ILLNESS (HPI):  32 y.o. female presents for evaluation of abdominal pain. Patient reports she has been experiencing pain after eating for at least 4 or 5 years, but she describes that while it used to be occasional and only after particularly fatty fried or greasy foods, it has more recently become after everything she eats "except water". Upon further questioning, however, patient clarifies that "everything" she eats includes meat and/or cheese. While she was previously advised she has gallstones, to avoid fatty foods, and to follow up with a surgeon to consider cholecystectomy, she has not and 2 weeks ago presented again to [facility] ED with abdominal pain and concern that a "gallstone had gotten" stuck "somewhere it was not supposed to be". Though labs were checked, only a CXR was performed, patient's pain improved, and surgical follow-up was again advised. Patient otherwise denies any fever/chills, though she admits to occasional CP and SOB and was recently told she has an enlarged heart and has a cardiology appointment 7/19.  PAST MEDICAL HISTORY (PMH):  Past Medical History:  Diagnosis Date  . Diabetes mellitus without complication (HCC)   . Gallstones   . Hypertension      PAST SURGICAL HISTORY (PSH):  Past Surgical History:  Procedure Laterality Date  . TONSILLECTOMY AND ADENOIDECTOMY Bilateral 02/11/2017   Procedure: TONSILLECTOMY;  Surgeon: Vaught, Creighton, MD;  Location: [facility] ORS;  Service: ENT;  Laterality: Bilateral;     MEDICATIONS:  Prior to Admission medications   Medication Sig Start Date End Date Taking? Authorizing Provider  amLODipine (NORVASC) 5 MG tablet Take 5 mg by mouth daily.   Yes [provider]  hydrochlorothiazide (HYDRODIURIL) 25 MG tablet Take 25 mg by mouth daily. 01/26/17  Yes  [provider]  HYDROcodone-acetaminophen (NORCO/VICODIN) 5-325 MG tablet Take 1 tablet by mouth every 4 (four) hours as needed for moderate pain. 06/30/17  Yes Kinner, Robert, MD  metFORMIN (GLUCOPHAGE-XR) 500 MG 24 hr tablet Take 1,000 mg by mouth daily with breakfast.   Yes [provider]  montelukast (SINGULAIR) 10 MG tablet Take 10 mg by mouth daily. 06/25/17  Yes [provider]  PROAIR HFA 108 (90 Base) MCG/ACT inhaler Inhale 2 puffs by mouth every 4 to 6 hours as needed for wheezing 05/12/17  Yes [provider]     ALLERGIES:  Allergies  Allergen Reactions  . Lisinopril Itching     SOCIAL HISTORY:  Social History   Socioeconomic History  . Marital status: Single    Spouse name: Not on file  . Number of children: Not on file  . Years of education: Not on file  . Highest education level: Not on file  Occupational History  . Not on file  Social Needs  . Financial resource strain: Not on file  . Food insecurity:    Worry: Not on file    Inability: Not on file  . Transportation needs:    Medical: Not on file    Non-medical: Not on file  Tobacco Use  . Smoking status: Never Smoker  . Smokeless tobacco: Never Used  Substance and Sexual Activity  . Alcohol use: No  . Drug use: No  . Sexual activity: Not on file  Lifestyle  . Physical activity:    Days per week: Not on file    Minutes per session: Not on file  .   Stress: Not on file  Relationships  . Social connections:    Talks on phone: Not on file    Gets together: Not on file    Attends religious service: Not on file    Active member of club or organization: Not on file    Attends meetings of clubs or organizations: Not on file    Relationship status: Not on file  . Intimate partner violence:    Fear of current or ex partner: Not on file    Emotionally abused: Not on file    Physically abused: Not on file    Forced sexual activity: Not on file  Other Topics Concern  . Not  on file  Social History Narrative  . Not on file    The patient currently resides (home / rehab facility / nursing home): Home The patient normally is (ambulatory / bedbound): Ambulatory  FAMILY HISTORY:  History reviewed. No pertinent family history.  Otherwise negative/non-contributory.  REVIEW OF SYSTEMS:  Constitutional: denies any other weight loss, fever, chills, or sweats  Eyes: denies any other vision changes, history of eye injury  ENT: denies sore throat, hearing problems  Respiratory: shortness of breath as per HPI, denies wheezing  Cardiovascular: chest pain as per HPI, denies palpitations  Gastrointestinal: abdominal pain, N/V, and bowel function as per HPI Musculoskeletal: denies any other joint pains or cramps  Skin: Denies any other rashes or skin discolorations Neurological: denies any other headache, dizziness, weakness  Psychiatric: Denies any other depression, anxiety   All other review of systems were otherwise negative   VITAL SIGNS:  BP (!) 150/100   Pulse 82   Temp 98.3 F (36.8 C) (Oral)   Wt 214 lb (97.1 kg)   BMI 37.91 kg/m   PHYSICAL EXAM:  Constitutional:  -- Obese body habitus  -- Awake, alert, and oriented x3  Eyes:  -- Pupils equally round and reactive to light  -- No scleral icterus  Ear, nose, throat:  -- No jugular venous distension -- No nasal drainage, bleeding Pulmonary:  -- No crackles  -- Equal breath sounds bilaterally -- Breathing non-labored at rest Cardiovascular:  -- S1, S2 present  -- No pericardial rubs  Gastrointestinal:  -- Abdomen soft and non-distended with minimal RUQ tenderness to palpation, no guarding/rebound  -- No abdominal masses appreciated, pulsatile or otherwise  Musculoskeletal and Integumentary:  -- Wounds or skin discoloration: None appreciated -- Extremities: B/L UE and LE FROM, hands and feet warm, no edema  Neurologic:  -- Motor function: Intact and symmetric -- Sensation: Intact and  symmetric  Labs:  CBC Latest Ref Rng & Units 06/30/2017 03/29/2017 02/11/2017  WBC 3.6 - 11.0 K/uL 6.4 10.4 -  Hemoglobin 12.0 - 16.0 g/dL 12.9 14.3 13.9  Hematocrit 35.0 - 47.0 % 38.1 42.5 41.0  Platelets 150 - 440 K/uL 224 264 -   CMP Latest Ref Rng & Units 06/30/2017 03/29/2017 02/11/2017  Glucose 65 - 99 mg/dL 107(H) 127(H) 133(H)  BUN 6 - 20 mg/dL 7 15 -  Creatinine 0.44 - 1.00 mg/dL 0.60 0.55 -  Sodium 135 - 145 mmol/L 135 136 140  Potassium 3.5 - 5.1 mmol/L 3.8 3.6 3.9  Chloride 101 - 111 mmol/L 104 101 -  CO2 22 - 32 mmol/L 25 23 -  Calcium 8.9 - 10.3 mg/dL 8.5(L) 8.9 -  Total Protein 6.5 - 8.1 g/dL 7.3 8.7(H) -  Total Bilirubin 0.3 - 1.2 mg/dL 0.5 0.7 -  Alkaline Phos 38 - 126 U/L   46 51 -  AST 15 - 41 U/L 16 23 -  ALT 14 - 54 U/L 15 19 -    Imaging studies:  Limited RUQ Abdominal Ultrasound (08/29/2015) Shadowing gallstones are present. The largest measures 9.6 mm. Wall thickness is within normal limits at 2.3 mm. There  is no sonographic Murphy's sign. Common bile duct diameter measures 3.8 mm, which is within normal limits.  Assessment/Plan: (ICD-10's: K80.00) 32 y.o. female with symptomatic cholelithiasis, complicated by pertinent comorbidities including morbid obesity (BMI 38), DM, HTN, asthma, and recently diagnosed cardiomegaly.              - avoid/minimize foods with higher fat content (meats, cheeses/dairy, and fried)             - prefer low-fat vegetables, whole grains (wheat bread, ceareals, etc), and fruits until cholecystectomy              - all risks, benefits, and alternatives to cholecystectomy were discussed with the patient, all of his questions were answered to his expressed satisfaction, patient expresses he wishes to proceed, and informed consent was obtained.             - will plan for laparoscopic cholecystectomy towards the end of July pending cardiology evaluation and OR availability             - anticipate return to clinic 2 weeks after above  planned surgery             - instructed to call if any questions or concerns  All of the above recommendations were discussed with the patient and patient's family, and all of patient's and family's questions were answered to their expressed satisfaction.  Thank you for the opportunity to participate in this patient's care.  -- Egbert Seidel E. Skylor Schnapp, MD, RPVI [place]: [facility] General Surgery - Partnering for exceptional care. Office: [phone] 

## 2017-07-19 ENCOUNTER — Telehealth: Payer: Self-pay | Admitting: Surgery

## 2017-07-19 NOTE — Telephone Encounter (Signed)
Pt advised of pre op date/time and sx date. Sx: 08/09/17 with Dr Davis-laparoscopic cholecystectomy.  Pre op: 08/02/17 between 9-1:00pm--phone interview.   Patient made aware to call [phone], between 1-3:00pm the day before surgery, to find out what time to arrive.

## 2017-07-30 DIAGNOSIS — R0789 Other chest pain: Secondary | ICD-10-CM | POA: Insufficient documentation

## 2017-07-30 DIAGNOSIS — R0602 Shortness of breath: Secondary | ICD-10-CM | POA: Insufficient documentation

## 2017-08-02 ENCOUNTER — Other Ambulatory Visit: Payer: Self-pay

## 2017-08-02 ENCOUNTER — Encounter
Admission: RE | Admit: 2017-08-02 | Discharge: 2017-08-02 | Disposition: A | Discharge status: Home / Self Care | Payer: Medicaid Other | Source: Ambulatory Visit | Attending: Surgery | Admitting: Surgery

## 2017-08-02 NOTE — Patient Instructions (Signed)
Your procedure is scheduled on: 08-09-17 MONDAY Report to Same Day Surgery 2nd floor medical mall [facility](Medical Mall Entrance-take elevator on left to 2nd floor.  Check in with surgery information desk.) To find out your arrival time please call [phone] between 1PM - 3PM on 08-06-17 FRIDAY  Remember: Instructions that are not followed completely may result in serious medical risk, up to and including death, or upon the discretion of your surgeon and anesthesiologist your surgery may need to be rescheduled.    _x___ 1. Do not eat food after midnight the night before your procedure. NO GUM OR CANDY AFTER MIDNIGHT.  You may drink WATER up to 2 hours before you are scheduled to arrive at the hospital for your procedure.  Do not drink WATER within 2 hours of your scheduled arrival to the hospital.  Type 1 and type 2 diabetics should only drink water.     __x__ 2. No Alcohol for 24 hours before or after surgery.   __x__3. No Smoking or e-cigarettes for 24 prior to surgery.  Do not use any chewable tobacco products for at least 6 hour prior to surgery   ____  4. Bring all medications with you on the day of surgery if instructed.    __x__ 5. Notify your doctor if there is any change in your medical condition     (cold, fever, infections).    x___6. On the morning of surgery brush your teeth with toothpaste and water.  You may rinse your mouth with mouth wash if you wish.  Do not swallow any toothpaste or mouthwash.   Do not wear jewelry, make-up, hairpins, clips or nail polish.  Do not wear lotions, powders, or perfumes. You may wear deodorant.  Do not shave 48 hours prior to surgery. Men may shave face and neck.  Do not bring valuables to the hospital.    [facility] is not responsible for any belongings or valuables.               Contacts, dentures or bridgework may not be worn into surgery.  Leave your suitcase in the car. After surgery it may be brought to your room.  For patients  admitted to the hospital, discharge time is determined by your treatment team.  _  Patients discharged the day of surgery will not be allowed to drive home.  You will need someone to drive you home and stay with you the night of your procedure.    Please read over the following fact sheets that you were given:   [facility] Preparing for Surgery   ____ Take anti-hypertensive listed below, cardiac, seizure, asthma, anti-reflux and psychiatric medicines. These include:  1. NONE  2.  3.  4.  5.  6.  ____Fleets enema or Magnesium Citrate as directed.   _x___ Use CHG Soap or sage wipes as directed on instruction sheet   _X___ Use inhalers on the day of surgery and bring to hospital day of surgery-BRING PRO-AIR INHALER WITH YOU TO HOSPITAL  _X___ Stop Metformin 2 days prior to surgery-LAST DOSE ON Friday, June 26TH    ____ Take 1/2 of usual insulin dose the night before surgery and none on the morning surgery.   ____ Follow recommendations from Cardiologist, Pulmonologist or PCP regarding stopping Aspirin, Coumadin, Plavix ,Eliquis, Effient, or Pradaxa, and Pletal.  X____Stop Anti-inflammatories such as Advil, Aleve, Ibuprofen, Motrin, Naproxen, Naprosyn, Goodies powders or aspirin products. OK to take Tylenol   ____ Stop supplements until after  surgery.     ____ Bring C-Pap to the hospital.

## 2017-08-05 ENCOUNTER — Encounter
Admission: RE | Admit: 2017-08-05 | Discharge: 2017-08-05 | Disposition: A | Discharge status: Home / Self Care | Payer: Medicaid Other | Source: Ambulatory Visit | Attending: Surgery | Admitting: Surgery

## 2017-08-05 DIAGNOSIS — Z01812 Encounter for preprocedural laboratory examination: Secondary | ICD-10-CM | POA: Insufficient documentation

## 2017-08-05 DIAGNOSIS — Z01810 Encounter for preprocedural cardiovascular examination: Secondary | ICD-10-CM | POA: Diagnosis not present

## 2017-08-05 LAB — POTASSIUM: Potassium: 3.4 mmol/L — ABNORMAL LOW (ref 3.5–5.1)

## 2017-08-06 NOTE — Pre-Procedure Instructions (Signed)
Progress Notes - documented in this encounter  Joan Alexander, Joan Borg, Joan Alexander - 07/30/2017 2:45 PM EDT Formatting of this note might be different from the original.   Chief Complaint: Chief Complaint  Patient presents with  . Establish Care  Elevated BP, Left sided Chest pains  Date of Service: 07/30/2017 Date of Birth: December 22, 1985 [facility]CP: [facility]  History of Present Illness: Joan Alexander is a 32 y.o.female patient who presents referral with complaints of hypertension and left-sided chest pains. Patient has a history of hypertension is been on amlodipine and hydrochlorothiazide for some time. She also has a history of diabetes and is on metformin for this. She has been noting fluctuating blood pressures as well as some pain in her chest along with shortness of breath. This can occur with rest or with activity but predominantly with exertion. Her EKG has shown sinus rhythm with no ischemia.  Past Medical and Surgical History  Past Medical History Past Medical History:  Diagnosis Date  . Hypertension   Past Surgical History She has a past surgical history that includes Tonsillectomy.   Medications and Allergies  Current Medications  Current Outpatient Medications  Medication Sig Dispense Refill  . albuterol (PROAIR HFA) 90 mcg/actuation inhaler Inhale into the lungs  . amLODIPine (NORVASC) 5 MG tablet TK 1 T PO D FOR HIGH BLOOD PRESSURE 1  . hydroCHLOROthiazide (HYDRODIURIL) 25 MG tablet TK 1 T PO D FOR HIGH BP 1  . metFORMIN (GLUCOPHAGE-XR) 500 MG XR tablet TK 2 TS PO D FOR DIABETES 4  . montelukast (SINGULAIR) 10 mg tablet TK 1 T PO D FOR ASTHMA OR ALLERGIES 4   No current facility-administered medications for this visit.   Allergies: Lisinopril  Social and Family History  Social History reports that she has never smoked. She has never used smokeless tobacco. Alcohol use questions deferred to the physician. She reports that she does not use drugs.  Family  History Family History  Problem Relation Age of Onset  . Diabetes Mother  . Heart disease Mother  . High blood pressure (Hypertension) Mother  . Kidney failure Mother  . Diabetes Father  . Heart disease Father  . High blood pressure (Hypertension) Father  . Stroke Father   Review of Systems  Review of Systems  Constitutional: Negative for chills, diaphoresis, fever, malaise/fatigue and weight loss.  HENT: Negative for congestion, ear discharge, hearing loss and tinnitus.  Eyes: Negative for blurred vision.  Respiratory: Positive for shortness of breath. Negative for cough, hemoptysis, sputum production and wheezing.  Cardiovascular: Positive for chest pain. Negative for palpitations, orthopnea, claudication, leg swelling and PND.  Gastrointestinal: Negative for abdominal pain, blood in stool, constipation, diarrhea, heartburn, melena, nausea and vomiting.  Genitourinary: Negative for dysuria, frequency, hematuria and urgency.  Musculoskeletal: Negative for back pain, falls, joint pain and myalgias.  Skin: Negative for itching and rash.  Neurological: Negative for dizziness, tingling, focal weakness, loss of consciousness, weakness and headaches.  Endo/Heme/Allergies: Negative for polydipsia. Does not bruise/bleed easily.  Psychiatric/Behavioral: Negative for depression, memory loss and substance abuse. The patient is not nervous/anxious.   Physical Examination   Vitals:BP 128/86  Pulse 97  Ht 160 cm (5\' 3" )  Wt 95.7 kg (210 lb 15.7 oz)  BMI 37.37 kg/m  Ht:160 cm (5\' 3" ) Wt:95.7 kg (210 lb 15.7 oz) ZOX:WRUE surface area is 2.06 meters squared. Body mass index is 37.37 kg/m.  Wt Readings from Last 3 Encounters:  07/30/17 95.7 kg (210 lb 15.7 oz)  BP Readings from Last 3 Encounters:  07/30/17 128/86   General appearance appears in no acute distress  Head Mouth and Eye exam Normocephalic, without obvious abnormality, atraumatic Dentition is good Eyes appear  anicteric       LUNGS Breath Sounds: Normal Percussion: Normal  CARDIOVASCULAR JVP CV wave: no HJR: no Elevation at 90 degrees: None Carotid Pulse: normal pulsation bilaterally Bruit: None Apex: apical impulse normal  Auscultation Rhythm: normal sinus rhythm S1: normal S2: normal Clicks: no Rub: no Murmurs: no murmurs  Gallop: None  EXTREMITIES Clubbing: no Edema: trace to 1+ bilateral pedal edema Pulses: peripheral pulses symmetrical Femoral Bruits: no Amputation: no SKIN Rash: no Cyanosis: no Embolic phemonenon: no Bruising: no NEURO Alert and Oriented to person, place and time: yes Non focal: yes  PSYCH: Pt appears to have normal affect  LABS REVIEWED Last 3 CBC results: No results found for: WBC No results found for: HGB No results found for: HCT  No results found for: PLT  No results found for: CREATININE, BUN, NA, K, CL, CO2  No results found for: HGBA1C  No results found for: HDL No results found for: LDLCALC No results found for: TRIG  No results found for: ALT, AST, GGT, ALKPHOS, BILITOT  No results found for: TSH  Diagnostic Studies Reviewed:  EKG EKG demonstrated normal sinus rhythm, nonspecific ST and T waves changes.  Assessment and Plan   32 y.o. female with  ICD-10-CM ICD-9-CM  1. Atypical chest pain-complains of chest pain. Features are both typical and atypical. Baseline electrocardiogram shows sinus rhythm. We will proceed with a ETT to evaluate for evidence of ischemia or exercise-induced arrhythmia to guide further therapy. R07.89 786.59 CARD stress test only, exercise  2. SOB (shortness of breath)-echo to evaluate for structural valvular abnormalities. R06.02 786.05 Echo complete  3. Hypertension-continue with amlodipine and hydrochlorothiazide. Low-sodium diet is recommended.  Return if symptoms worsen or fail to improve.  These notes generated with voice recognition software. I apologize for typographical  errors.  Denton Ar, Joan Alexander    Electronically signed by Denton Ar, Joan Alexander at 08/02/2017 9:53 AM EDT     Plan of Treatment - documented as of this encounter  Pending Results Pending Results  Name Type Priority Associated Diagnoses Date/Time  CARD stress test only, exercise Cardiac Services Routine Atypical chest pain  08/05/2017 3:10 PM EDT   Scheduled Orders Scheduled Orders  Name Type Priority Associated Diagnoses Order Schedule  CARD stress test only, exercise Cardiac Services Routine Atypical chest pain  Expected: 08/06/2017, Expires: 07/31/2018   Imaging Results - documented in this encounter   Echo complete (08/05/2017 3:01 PM EDT) Echo complete (08/05/2017 3:01 PM EDT)  Component Value Ref Range Performed At Pathologist Signature  LV Ejection Fraction (%) 55  [facility]   Aortic Valve Stenosis Grade none  [facility]   Aortic Valve Regurgitation Grade trivial  [facility]   Aortic Valve Max Velocity (m/s) 1.4 m/sec [facility]   Mitral Valve Stenosis Grade none  [facility]   Mitral Valve Regurgitation Grade trivial  [facility]   Tricuspid Valve Regurgitation Grade mild  [facility]   Tricuspid Valve Regurgitation Max Velocity (m/s) 2.4 m/sec [facility]   Right Ventricle Systolic Pressure (mmHg) 28.0 mmHg [facility]   LV End Diastolic Diameter (cm) 4.8 cm [facility]   LV End Systolic Diameter (  cm) 3.5 cm [facility]   LV Septum Wall Thickness (cm) 1.2 cm [facility]   LV Posterior Wall Thickness (cm) 1.1 cm [facility]   Left Atrium Diameter (cm) 3.9 cm [facility]    Echo complete (08/05/2017 3:01 PM EDT)  Specimen     Echo complete (08/05/2017 3:01 PM EDT)  Narrative Performed At   CARDIOLOGY DEPARTMENT Julieanne MansonPINNIX, Joan Alexander  [id number]  [facility] Acct #: [id number]  [address], [place], [place] [id number] Date: 08/05/2017 02: 10 PM   Adult Female Age: 76 yrs  ECHOCARDIOGRAM REPORTOutpatient   KC^^KCWC   STUDY:CHEST WALL TAPE:MD1: Denton ArFATH, KENNETH ALAN  ECHO:YesDOPPLER:Yes FILE:BP: 128/86 mmHg   COLOR:Yes CONTRAST:No MACHINE:Philips  RV BIOPSY:No3D:NoSOUND QLTY:ModerateHeight: 63 in  MEDIUM:NoneWeight: 210 lb   BSA: 2.0 m2  _________________________________________________________________________________________  HISTORY: DOE   REASON: Assess, LV function   INDICATION: SOB (shortness of breath) [R06.02 (ICD-10-CM)]  _________________________________________________________________________________________  ECHOCARDIOGRAPHIC MEASUREMENTS  2D DIMENSIONS  AORTAValues Normal Range MAIN PA ValuesNormal Range  Annulus: 1.9 cm [2.1-2.5] PA Main: nm* [1.5-2.1]  Aorta Sin: 3.0 cm [2.7-3.3]RIGHT VENTRICLE  ST Junction: nm*[2.3-2.9] RV Base: nm* [<4.2]  Asc.Aorta: nm*[2.3-3.1]RV Mid: nm* [<3.5]  LEFT VENTRICLERV Length: 3.2 cm[<8.6]  LVIDd: 4.8 cm [3.9-5.3]INFERIOR VENA CAVA  LVIDs: 3.5 cmMax. IVC: nm* [<=2.1]     FS: 28.6 % [>25]Min. IVC: nm*  SWT: 1.2 cm [0.5-0.9]------------------  PWT: 1.1 cm [0.5-0.9]nm* - not measured  LEFT ATRIUM  LA Diam: 3.9 cm [2.7-3.8]  LA A4C Area: nm*[<20]  LA Volume: nm*[22-52]  _________________________________________________________________________________________  ECHOCARDIOGRAPHIC DESCRIPTIONS  AORTIC ROOT   Size: Normal   Dissection: INDETERM FOR DISSECTION  AORTIC VALVE   Leaflets: Tricuspid Morphology: Normal   Mobility: Fully mobile  LEFT VENTRICLE   Size: NormalAnterior: Normal  Contraction: Normal Lateral: Normal   Closest EF: >55% (Estimated)Septal: Normal  LV Masses: No Masses Apical: Normal  LVH: MILD LVHInferior: Normal  Posterior: Normal   Dias.FxClass: N/A  MITRAL VALVE   Leaflets: NormalMobility: Fully mobile   Morphology: Normal  LEFT ATRIUM   Size: Normal LA Masses: No masses  IA Septum: Normal IAS  MAIN PA   Size: Normal  PULMONIC VALVE   Morphology: NormalMobility: Fully mobile  RIGHT VENTRICLE  RV Masses: No Masses Size: Normal  Free Wall: Normal Contraction: Normal  TRICUSPID VALVE   Leaflets: NormalMobility: Fully mobile   Morphology: Normal  RIGHT ATRIUM   Size:  NormalRA Other: None  RA Mass: No masses  PERICARDIUM  Fluid: No effusion  INFERIOR VENACAVA   Size: Normal Normal respiratory collapse  _________________________________________________________________________________________    DOPPLER ECHO and OTHER SPECIAL PROCEDURES   Aortic: TRIVIAL AR No AS   138.8 cm/sec peak vel7.7 mmHg peak grad   Mitral: TRIVIAL MR No MS   4.8 cm^2 by DOPPLER   MV Inflow E Vel = 81.5 cm/sec MV Annulus E'Vel = 6.4 cm/sec   E/E'Ratio = 12.7  Tricuspid: MILD TRNo TS   239.8 cm/sec peak TR vel 28.0 mmHg peak RV pressure  Pulmonary: No PRNo PS   103.2 cm/sec peak vel4.3 mmHg peak grad  _________________________________________________________________________________________  INTERPRETATION  NORMAL LEFT VENTRICULAR SYSTOLIC FUNCTION WITH MILD LVH  NORMAL RIGHT VENTRICULAR SYSTOLIC FUNCTION  MILD VALVULAR REGURGITATION (See above)  NO VALVULAR STENOSIS  TRIVIAL AR, MR  MILD TR  EF 55%  Closest EF: >55% (Estimated)  Mitral: TRIVIAL MR  Tricuspid: MILD TR  _________________________________________________________________________________________  Electronically signed byMD Rocky LinkKen  Joan Alexander on 08/05/2017 08: 39 PM   Performed By: Merla Riches, RDCS   Ordering Physician: Harold Hedge  _________________________________________________________________________________________    [facility]    Echo complete (08/05/2017 3:01 PM EDT)  Procedure Note  Interface, Text Results In - 08/05/2017 8:40 PM EDT  CARDIOLOGY DEPARTMENT SAHANA, [place] [facility]  [id number] [facility] Acct #: [id number] [address] [place], [place] [address] Date: 08/05/2017 02: 10 PM Adult Female Age: 7 yrs ECHOCARDIOGRAM REPORT Outpatient [facility] STUDY:CHEST WALL TAPE: MD1: Joan Alexander, KENNETH ALAN ECHO:Yes DOPPLER:Yes FILE: BP: 128/86 mmHg COLOR:Yes CONTRAST:No MACHINE:Philips RV BIOPSY:No 3D:No SOUND QLTY:Moderate Height: 63 in MEDIUM:None Weight: 210 lb BSA: 2.0 m2 _________________________________________________________________________________________ HISTORY: DOE REASON: Assess, LV function INDICATION: SOB (shortness of breath) [R06.02 (ICD-10-CM)] _________________________________________________________________________________________ ECHOCARDIOGRAPHIC MEASUREMENTS 2D DIMENSIONS AORTA Values Normal Range MAIN PA Values Normal Range Annulus: 1.9 cm [2.1-2.5] PA Main: nm* [1.5-2.1] Aorta Sin: 3.0 cm [2.7-3.3] RIGHT VENTRICLE ST Junction: nm* [2.3-2.9] RV Base: nm* [<4.2] Asc.Aorta: nm* [2.3-3.1] RV Mid: nm* [<3.5] LEFT VENTRICLE RV Length: 3.2 cm [<8.6] LVIDd: 4.8 cm [3.9-5.3] INFERIOR VENA CAVA LVIDs: 3.5 cm Max. IVC: nm* [<=2.1] FS: 28.6 % [>25] Min. IVC: nm* SWT: 1.2 cm [0.5-0.9] ------------------ PWT: 1.1 cm [0.5-0.9] nm* - not measured LEFT ATRIUM LA Diam: 3.9 cm [2.7-3.8] LA A4C Area: nm* [<20] LA Volume: nm* [22-52] _________________________________________________________________________________________ ECHOCARDIOGRAPHIC DESCRIPTIONS AORTIC ROOT Size: Normal Dissection: INDETERM FOR DISSECTION AORTIC VALVE Leaflets: Tricuspid Morphology: Normal Mobility: Fully mobile LEFT VENTRICLE Size: Normal Anterior: Normal Contraction: Normal Lateral: Normal Closest EF: >55% (Estimated) Septal: Normal LV Masses: No Masses Apical: Normal LVH: MILD LVH Inferior: Normal Posterior: Normal Dias.FxClass: N/A MITRAL VALVE Leaflets: Normal Mobility: Fully mobile Morphology: Normal LEFT ATRIUM Size: Normal LA Masses: No masses IA  Septum: Normal IAS MAIN PA Size: Normal PULMONIC VALVE Morphology: Normal Mobility: Fully mobile RIGHT VENTRICLE RV Masses: No Masses Size: Normal Free Wall: Normal Contraction: Normal TRICUSPID VALVE Leaflets: Normal Mobility: Fully mobile Morphology: Normal RIGHT ATRIUM Size: Normal RA Other: None RA Mass: No masses PERICARDIUM Fluid: No effusion INFERIOR VENACAVA Size: Normal Normal respiratory collapse _________________________________________________________________________________________  DOPPLER ECHO and OTHER SPECIAL PROCEDURES Aortic: TRIVIAL AR No AS 138.8 cm/sec peak vel 7.7 mmHg peak grad Mitral: TRIVIAL MR No MS 4.8 cm^2 by DOPPLER MV Inflow E Vel = 81.5 cm/sec MV Annulus E'Vel = 6.4 cm/sec E/E'Ratio = 12.7 Tricuspid: MILD TR No TS 239.8 cm/sec peak TR vel 28.0 mmHg peak RV pressure Pulmonary: No PR No PS 103.2 cm/sec peak vel 4.3 mmHg peak grad _________________________________________________________________________________________ INTERPRETATION NORMAL LEFT VENTRICULAR SYSTOLIC FUNCTION WITH MILD LVH NORMAL RIGHT VENTRICULAR SYSTOLIC FUNCTION MILD VALVULAR REGURGITATION (See above) NO VALVULAR STENOSIS TRIVIAL AR, MR MILD TR EF 55% Closest EF: >55% (Estimated) Mitral: TRIVIAL MR Tricuspid: MILD TR _________________________________________________________________________________________ Electronically signed by Joan Alexander Mariel Kansky on 08/05/2017 08: 39 PM Performed By: Merla Riches, RDCS Ordering Physician: Harold Hedge _________________________________________________________________________________________     Echo complete (08/05/2017 3:01 PM EDT)  Performing Organization Address City/State/Zipcode Phone Number  [facility]          Visit Diagnoses - documented in this encounter  Diagnosis  Atypical chest pain - Primary  Other chest pain   SOB (shortness of breath)  Shortness of breath    Historical Medications - added  in this encounter  This list may reflect changes made after this encounter.  Medication Sig Dispensed Refills Start Date End Date  montelukast (SINGULAIR) 10 mg tablet  TK 1 T PO  D FOR ASTHMA OR ALLERGIES  4 06/25/2017   albuterol (PROAIR HFA) 90 mcg/actuation inhaler  Inhale into the lungs  0 05/12/2017   amLODIPine (NORVASC) 5 MG tablet  TK 1 T PO D FOR HIGH BLOOD PRESSURE  1 05/10/2017   hydroCHLOROthiazide (HYDRODIURIL) 25 MG tablet  TK 1 T PO D FOR HIGH BP  1 05/12/2017   metFORMIN (GLUCOPHAGE-XR) 500 MG XR tablet  TK 2 TS PO D FOR DIABETES  4 06/25/2017    Images Patient Contacts   Contact Name Contact Address Communication Relationship to Patient  Alfhild Partch Unknown [phone] [facility]) Brother or Sister, Emergency Contact   Document Information  Primary Care Provider Other Service Providers Document Coverage Dates  [facility] (Jul. 19, 2019July 19, 2019 - Present) [phone] (Work) [phone] (Fax) [facility] [address] [place], [place] [address]   Jul. 19, 2019July 19, 2019   Custodian Organization  [facility] [address] [place], [place] [address]   Encounter Providers Encounter Date  Denton Ar Joan Alexander (Attending) [phone] (Work) [phone] (Fax) [phone] [facility] [address] [facility] - CARDIOLOGY [place], [place] [address]  Jul. 19, 2019July 19, 2019

## 2017-08-06 NOTE — Pre-Procedure Instructions (Signed)
Cardiac clearance on chart from Dr Docia FurlFath-Low Risk

## 2017-08-06 NOTE — Pre-Procedure Instructions (Signed)
Spoke with Dr America BrownFath's nurse Herbert SetaHeather regarding pt having echo and stress done yesterday. Anesthesia did NOT request clearance for this pt but I did ask Herbert SetaHeather if Dr Lady GaryFath could send over note stating pt ok for surgery on Monday. Herbert SetaHeather states he has read the Echo which is normal but he has not read the Stress test yet.  She said once he reads stress she will get him to fill out form stating if pt is ok for her surgery. [facility]

## 2017-08-06 NOTE — Pre-Procedure Instructions (Signed)
Imaging Results - documented in this encounter   Echo complete (08/05/2017 3:01 PM EDT) Echo complete (08/05/2017 3:01 PM EDT)  Component Value Ref Range Performed At Pathologist Signature  LV Ejection Fraction (%) 55  [facility]   Aortic Valve Stenosis Grade none  [facility]   Aortic Valve Regurgitation Grade trivial  [facility]   Aortic Valve Max Velocity (m/s) 1.4 m/sec [facility]   Mitral Valve Stenosis Grade none  [facility]   Mitral Valve Regurgitation Grade trivial  [facility]   Tricuspid Valve Regurgitation Grade mild  [facility]   Tricuspid Valve Regurgitation Max Velocity (m/s) 2.4 m/sec [facility]   Right Ventricle Systolic Pressure (mmHg) 28.0 mmHg [facility]   LV End Diastolic Diameter (cm) 4.8 cm [facility]   LV End Systolic Diameter (cm) 3.5 cm [facility]   LV Septum Wall Thickness (cm) 1.2 cm [facility]   LV Posterior Wall Thickness (cm) 1.1 cm [facility]   Left Atrium Diameter (cm) 3.9 cm [facility]    Echo complete (08/05/2017 3:01 PM EDT)  Specimen     Echo complete (08/05/2017 3:01 PM EDT)  Narrative Performed At   CARDIOLOGY DEPARTMENT Julieanne Manson  [facility] Acct #: [id number]  [address] [place], [place] [address] Date: 08/05/2017 02: 10 PM   Adult Female Age: 78 yrs  ECHOCARDIOGRAM REPORTOutpatient   [facility]^^[facility]   STUDY:CHEST WALL TAPE:MD1: [place] Ar  ECHO:YesDOPPLER:Yes  FILE:BP: 128/86 mmHg   COLOR:Yes CONTRAST:No MACHINE:Philips  RV BIOPSY:No3D:NoSOUND QLTY:ModerateHeight: 63 in  MEDIUM:NoneWeight: 210 lb   BSA: 2.0 m2  _________________________________________________________________________________________  HISTORY: DOE   REASON: Assess, LV function   INDICATION: SOB (shortness of breath) [R06.02 (ICD-10-CM)]  _________________________________________________________________________________________  ECHOCARDIOGRAPHIC MEASUREMENTS  2D DIMENSIONS  AORTAValues Normal Range MAIN PA ValuesNormal Range  Annulus: 1.9 cm [2.1-2.5] PA Main: nm* [1.5-2.1]  Aorta Sin: 3.0 cm [2.7-3.3]RIGHT VENTRICLE  ST Junction: nm*[2.3-2.9] RV Base: nm* [<4.2]  Asc.Aorta: nm*[2.3-3.1]RV Mid: nm* [<3.5]  LEFT VENTRICLERV Length: 3.2 cm[<8.6]  LVIDd: 4.8 cm [3.9-5.3]INFERIOR VENA CAVA  LVIDs: 3.5 cmMax. IVC: nm* [<=2.1]   FS: 28.6 % [>25]Min. IVC: nm*  SWT: 1.2 cm [0.5-0.9]------------------  PWT: 1.1 cm [0.5-0.9]nm* - not measured  LEFT ATRIUM  LA Diam: 3.9 cm [2.7-3.8]  LA A4C Area: nm*[<20]  LA Volume: nm*[22-52]  _________________________________________________________________________________________  ECHOCARDIOGRAPHIC DESCRIPTIONS  AORTIC ROOT   Size: Normal   Dissection: INDETERM FOR  DISSECTION  AORTIC VALVE   Leaflets: Tricuspid Morphology: Normal   Mobility: Fully mobile  LEFT VENTRICLE   Size: NormalAnterior: Normal  Contraction: Normal Lateral: Normal   Closest EF: >55% (Estimated)Septal: Normal  LV Masses: No Masses Apical: Normal  LVH: MILD LVHInferior: Normal  Posterior: Normal   Dias.FxClass: N/A  MITRAL VALVE   Leaflets: NormalMobility: Fully mobile   Morphology: Normal  LEFT ATRIUM   Size: Normal LA Masses: No masses  IA Septum: Normal IAS  MAIN PA   Size: Normal  PULMONIC VALVE   Morphology: NormalMobility: Fully mobile  RIGHT VENTRICLE  RV Masses: No Masses Size: Normal  Free Wall: Normal Contraction: Normal  TRICUSPID VALVE   Leaflets: NormalMobility: Fully mobile   Morphology: Normal  RIGHT ATRIUM   Size: NormalRA Other: None  RA Mass: No masses  PERICARDIUM  Fluid: No effusion  INFERIOR  VENACAVA   Size: Normal Normal respiratory collapse  _________________________________________________________________________________________    DOPPLER ECHO and OTHER SPECIAL PROCEDURES   Aortic: TRIVIAL AR No AS   138.8 cm/sec peak vel7.7 mmHg peak grad   Mitral: TRIVIAL MR No MS   4.8 cm^2 by  DOPPLER   MV Inflow E Vel = 81.5 cm/sec MV Annulus E'Vel = 6.4 cm/sec   E/E'Ratio = 12.7  Tricuspid: MILD TRNo TS   239.8 cm/sec peak TR vel 28.0 mmHg peak RV pressure  Pulmonary: No PRNo PS   103.2 cm/sec peak vel4.3 mmHg peak grad  _________________________________________________________________________________________  INTERPRETATION  NORMAL LEFT VENTRICULAR SYSTOLIC FUNCTION WITH MILD LVH  NORMAL RIGHT VENTRICULAR SYSTOLIC FUNCTION  MILD VALVULAR REGURGITATION (See above)  NO VALVULAR STENOSIS  TRIVIAL AR, MR  MILD TR  EF 55%  Closest EF: >55% (Estimated)  Mitral: TRIVIAL MR  Tricuspid: MILD TR  _________________________________________________________________________________________  Electronically signed byMD Mariel KanskyKen Fath on 08/05/2017 08: 39 PM   Performed By: Merla Richesoar, Christy C, RDCS   Ordering Physician: Harold HedgeFATH, KENNETH  _________________________________________________________________________________________    [facility]    Echo complete (08/05/2017 3:01 PM EDT)  Procedure Note  Interface, Text Results In - 08/05/2017 8:40 PM EDT  CARDIOLOGY DEPARTMENT Archer AsaINNIX, [facility] [id number] [facility] Acct #: [id number] [address], [place], [place] [id number] Date: 08/05/2017 02: 10 PM Adult Female Age: 32 yrs ECHOCARDIOGRAM REPORT [facility]patient [facility]^^[facility] STUDY:CHEST WALL TAPE: MD1: FATH, KENNETH ALAN ECHO:Yes DOPPLER:Yes FILE: BP: 128/86 mmHg COLOR:Yes CONTRAST:No MACHINE:Philips RV BIOPSY:No 3D:No SOUND QLTY:Moderate Height: 63 in MEDIUM:None Weight: 210 lb BSA: 2.0 m2 _________________________________________________________________________________________ HISTORY: DOE REASON: Assess, LV function INDICATION: SOB (shortness of  breath) [R06.02 (ICD-10-CM)] _________________________________________________________________________________________ ECHOCARDIOGRAPHIC MEASUREMENTS 2D DIMENSIONS AORTA Values Normal Range MAIN PA Values Normal Range Annulus: 1.9 cm [2.1-2.5] PA Main: nm* [1.5-2.1] Aorta Sin: 3.0 cm [2.7-3.3] RIGHT VENTRICLE ST Junction: nm* [2.3-2.9] RV Base: nm* [<4.2] Asc.Aorta: nm* [2.3-3.1] RV Mid: nm* [<3.5] LEFT VENTRICLE RV Length: 3.2 cm [<8.6] LVIDd: 4.8 cm [3.9-5.3] INFERIOR VENA CAVA LVIDs: 3.5 cm Max. IVC: nm* [<=2.1] FS: 28.6 % [>25] Min. IVC: nm* SWT: 1.2 cm [0.5-0.9] ------------------ PWT: 1.1 cm [0.5-0.9] nm* - not measured LEFT ATRIUM LA Diam: 3.9 cm [2.7-3.8] LA A4C Area: nm* [<20] LA Volume: nm* [22-52] _________________________________________________________________________________________ ECHOCARDIOGRAPHIC DESCRIPTIONS AORTIC ROOT Size: Normal Dissection: INDETERM FOR DISSECTION AORTIC VALVE Leaflets: Tricuspid Morphology: Normal Mobility: Fully mobile LEFT VENTRICLE Size: Normal Anterior: Normal Contraction: Normal Lateral: Normal Closest EF: >55% (Estimated) Septal: Normal LV Masses: No Masses Apical: Normal LVH: MILD LVH Inferior: Normal Posterior: Normal Dias.FxClass: N/A MITRAL VALVE Leaflets: Normal Mobility: Fully mobile Morphology: Normal LEFT ATRIUM Size: Normal LA Masses: No masses IA Septum: Normal IAS MAIN PA Size: Normal PULMONIC VALVE Morphology: Normal Mobility: Fully mobile RIGHT VENTRICLE RV Masses: No Masses Size: Normal Free Wall: Normal Contraction: Normal TRICUSPID VALVE Leaflets: Normal Mobility: Fully mobile Morphology: Normal RIGHT ATRIUM Size: Normal RA Other: None RA Mass: No masses PERICARDIUM Fluid: No effusion INFERIOR VENACAVA Size: Normal Normal respiratory collapse _________________________________________________________________________________________  DOPPLER ECHO and OTHER SPECIAL PROCEDURES Aortic: TRIVIAL AR No  AS 138.8 cm/sec peak vel 7.7 mmHg peak grad Mitral: TRIVIAL MR No MS 4.8 cm^2 by DOPPLER MV Inflow E Vel = 81.5 cm/sec MV Annulus E'Vel = 6.4 cm/sec E/E'Ratio = 12.7 Tricuspid: MILD TR No TS 239.8 cm/sec peak TR vel 28.0 mmHg peak RV pressure Pulmonary: No PR No PS 103.2 cm/sec peak vel 4.3 mmHg peak grad _________________________________________________________________________________________ INTERPRETATION NORMAL LEFT VENTRICULAR  SYSTOLIC FUNCTION WITH MILD LVH NORMAL RIGHT VENTRICULAR SYSTOLIC FUNCTION MILD VALVULAR REGURGITATION (See above) NO VALVULAR STENOSIS TRIVIAL AR, MR MILD TR EF 55% Closest EF: >55% (Estimated) Mitral: TRIVIAL MR Tricuspid: MILD TR _________________________________________________________________________________________ Electronically signed by MD Mariel Kansky on 08/05/2017 08: 39 PM Performed By: Merla Riches, RDCS Ordering Physician: Harold Hedge _________________________________________________________________________________________     Echo complete (08/05/2017 3:01 PM EDT)  Performing Organization Address City/State/Zipcode Phone Number  [facility]          Visit Diagnoses - documented in this encounter  Diagnosis  SOB (shortness of breath)  Shortness of breath    Images Patient Contacts   Contact Name Contact Address Communication Relationship to Patient  Elmyra Banwart Unknown [phone] [facility]) Brother or Sister, Emergency Contact   Document Information  Primary Care Provider Other Service Providers Document Coverage Dates  [facility] (Jul. 19, 2019July 19, 2019 - Present) [phone] (Work) [phone] (Fax) [facility] [address] [place], [place] [address]   Jul. 25, 2019July 25, 2019   Custodian Organization  [facility] [address] [place], [place] [address]   Encounter Providers

## 2017-08-08 MED ORDER — CEFAZOLIN SODIUM-DEXTROSE 2-4 GM/100ML-% IV SOLN
2.0000 g | INTRAVENOUS | Status: AC
  Administered 2017-08-09: 2 g via INTRAVENOUS

## 2017-08-09 ENCOUNTER — Ambulatory Visit: Payer: Medicaid Other | Admitting: Anesthesiology

## 2017-08-09 ENCOUNTER — Encounter
Admission: RE | Disposition: A | Discharge status: Home / Self Care | Payer: Self-pay | Source: Ambulatory Visit | Attending: Surgery

## 2017-08-09 ENCOUNTER — Other Ambulatory Visit: Payer: Self-pay

## 2017-08-09 ENCOUNTER — Encounter: Payer: Self-pay | Admitting: Anesthesiology

## 2017-08-09 ENCOUNTER — Ambulatory Visit
Admission: RE | Admit: 2017-08-09 | Discharge: 2017-08-09 | Disposition: A | Discharge status: Home / Self Care | Payer: Medicaid Other | Source: Ambulatory Visit | Attending: Surgery | Admitting: Surgery

## 2017-08-09 DIAGNOSIS — J45909 Unspecified asthma, uncomplicated: Secondary | ICD-10-CM | POA: Diagnosis not present

## 2017-08-09 DIAGNOSIS — I10 Essential (primary) hypertension: Secondary | ICD-10-CM | POA: Diagnosis not present

## 2017-08-09 DIAGNOSIS — K8020 Calculus of gallbladder without cholecystitis without obstruction: Secondary | ICD-10-CM

## 2017-08-09 DIAGNOSIS — K811 Chronic cholecystitis: Secondary | ICD-10-CM | POA: Diagnosis not present

## 2017-08-09 DIAGNOSIS — E119 Type 2 diabetes mellitus without complications: Secondary | ICD-10-CM | POA: Insufficient documentation

## 2017-08-09 DIAGNOSIS — Z6837 Body mass index (BMI) 37.0-37.9, adult: Secondary | ICD-10-CM | POA: Diagnosis not present

## 2017-08-09 DIAGNOSIS — Z7984 Long term (current) use of oral hypoglycemic drugs: Secondary | ICD-10-CM | POA: Insufficient documentation

## 2017-08-09 DIAGNOSIS — Z79899 Other long term (current) drug therapy: Secondary | ICD-10-CM | POA: Diagnosis not present

## 2017-08-09 DIAGNOSIS — K8010 Calculus of gallbladder with chronic cholecystitis without obstruction: Secondary | ICD-10-CM | POA: Insufficient documentation

## 2017-08-09 DIAGNOSIS — R109 Unspecified abdominal pain: Secondary | ICD-10-CM | POA: Diagnosis present

## 2017-08-09 HISTORY — PX: CHOLECYSTECTOMY: SHX55

## 2017-08-09 LAB — GLUCOSE, CAPILLARY
Glucose-Capillary: 100 mg/dL — ABNORMAL HIGH (ref 70–99)
Glucose-Capillary: 178 mg/dL — ABNORMAL HIGH (ref 70–99)

## 2017-08-09 LAB — POCT PREGNANCY, URINE: Preg Test, Ur: NEGATIVE

## 2017-08-09 SURGERY — LAPAROSCOPIC CHOLECYSTECTOMY
Anesthesia: General | Site: Abdomen | Wound class: Contaminated

## 2017-08-09 MED ORDER — EPHEDRINE SULFATE 50 MG/ML IJ SOLN
INTRAMUSCULAR | Status: AC
Start: 2017-08-09 — End: ?
  Filled 2017-08-09: qty 1

## 2017-08-09 MED ORDER — ROCURONIUM BROMIDE 50 MG/5ML IV SOLN
INTRAVENOUS | Status: AC
  Filled 2017-08-09: qty 1

## 2017-08-09 MED ORDER — CEFAZOLIN SODIUM-DEXTROSE 2-4 GM/100ML-% IV SOLN
INTRAVENOUS | Status: AC
  Filled 2017-08-09: qty 100

## 2017-08-09 MED ORDER — OXYCODONE-ACETAMINOPHEN 5-325 MG PO TABS: 1.0000 | ORAL_TABLET | ORAL | 0 refills | Status: DC | PRN

## 2017-08-09 MED ORDER — SUGAMMADEX SODIUM 200 MG/2ML IV SOLN
INTRAVENOUS | Status: AC
  Filled 2017-08-09: qty 2

## 2017-08-09 MED ORDER — ACETAMINOPHEN 500 MG PO TABS
1000.0000 mg | ORAL_TABLET | ORAL | Status: AC
  Administered 2017-08-09: 1000 mg via ORAL

## 2017-08-09 MED ORDER — EPHEDRINE SULFATE 50 MG/ML IJ SOLN
INTRAMUSCULAR | Status: DC | PRN
  Administered 2017-08-09 (×3): 5 mg via INTRAVENOUS

## 2017-08-09 MED ORDER — DEXAMETHASONE SODIUM PHOSPHATE 10 MG/ML IJ SOLN
INTRAMUSCULAR | Status: DC | PRN
  Administered 2017-08-09: 5 mg via INTRAVENOUS

## 2017-08-09 MED ORDER — FENTANYL CITRATE (PF) 100 MCG/2ML IJ SOLN
INTRAMUSCULAR | Status: AC
  Filled 2017-08-09: qty 2

## 2017-08-09 MED ORDER — MIDAZOLAM HCL 2 MG/2ML IJ SOLN
INTRAMUSCULAR | Status: AC
Start: 2017-08-09 — End: ?
  Filled 2017-08-09: qty 2

## 2017-08-09 MED ORDER — CHLORHEXIDINE GLUCONATE CLOTH 2 % EX PADS: 6.0000 | MEDICATED_PAD | Freq: Once | CUTANEOUS | Status: DC

## 2017-08-09 MED ORDER — GABAPENTIN 300 MG PO CAPS
300.0000 mg | ORAL_CAPSULE | ORAL | Status: AC
  Administered 2017-08-09: 300 mg via ORAL

## 2017-08-09 MED ORDER — FENTANYL CITRATE (PF) 100 MCG/2ML IJ SOLN
INTRAMUSCULAR | Status: AC
  Administered 2017-08-09: 25 ug via INTRAVENOUS
  Filled 2017-08-09: qty 2

## 2017-08-09 MED ORDER — GABAPENTIN 300 MG PO CAPS
ORAL_CAPSULE | ORAL | Status: AC
  Administered 2017-08-09: 300 mg via ORAL
  Filled 2017-08-09: qty 1

## 2017-08-09 MED ORDER — ROCURONIUM BROMIDE 100 MG/10ML IV SOLN
INTRAVENOUS | Status: DC | PRN
  Administered 2017-08-09: 50 mg via INTRAVENOUS

## 2017-08-09 MED ORDER — FENTANYL CITRATE (PF) 100 MCG/2ML IJ SOLN
INTRAMUSCULAR | Status: DC | PRN
  Administered 2017-08-09 (×2): 50 ug via INTRAVENOUS

## 2017-08-09 MED ORDER — OXYCODONE-ACETAMINOPHEN 5-325 MG PO TABS
ORAL_TABLET | ORAL | Status: AC
  Filled 2017-08-09: qty 1

## 2017-08-09 MED ORDER — PROPOFOL 10 MG/ML IV BOLUS
INTRAVENOUS | Status: DC | PRN
  Administered 2017-08-09: 200 mg via INTRAVENOUS

## 2017-08-09 MED ORDER — SODIUM CHLORIDE 0.9 % IV SOLN
INTRAVENOUS | Status: DC
  Administered 2017-08-09: 09:00:00 via INTRAVENOUS

## 2017-08-09 MED ORDER — OXYCODONE-ACETAMINOPHEN 5-325 MG PO TABS
1.0000 | ORAL_TABLET | Freq: Once | ORAL | Status: AC
  Administered 2017-08-09: 1 via ORAL

## 2017-08-09 MED ORDER — PHENYLEPHRINE HCL 10 MG/ML IJ SOLN
INTRAMUSCULAR | Status: AC
  Filled 2017-08-09: qty 1

## 2017-08-09 MED ORDER — FAMOTIDINE 20 MG PO TABS
20.0000 mg | ORAL_TABLET | Freq: Once | ORAL | Status: AC
  Administered 2017-08-09: 20 mg via ORAL

## 2017-08-09 MED ORDER — BUPIVACAINE HCL (PF) 0.5 % IJ SOLN
INTRAMUSCULAR | Status: AC
  Filled 2017-08-09: qty 30

## 2017-08-09 MED ORDER — BUPIVACAINE HCL 0.5 % IJ SOLN
INTRAMUSCULAR | Status: DC | PRN
  Administered 2017-08-09: 20 mL via INTRAMUSCULAR

## 2017-08-09 MED ORDER — LIDOCAINE HCL (CARDIAC) PF 100 MG/5ML IV SOSY
PREFILLED_SYRINGE | INTRAVENOUS | Status: DC | PRN
  Administered 2017-08-09: 100 mg via INTRAVENOUS

## 2017-08-09 MED ORDER — ONDANSETRON HCL 4 MG/2ML IJ SOLN: 4.0000 mg | Freq: Once | INTRAMUSCULAR | Status: DC | PRN

## 2017-08-09 MED ORDER — LIDOCAINE HCL (PF) 2 % IJ SOLN
INTRAMUSCULAR | Status: AC
  Filled 2017-08-09: qty 10

## 2017-08-09 MED ORDER — ONDANSETRON HCL 4 MG/2ML IJ SOLN
INTRAMUSCULAR | Status: AC
  Filled 2017-08-09: qty 2

## 2017-08-09 MED ORDER — FAMOTIDINE 20 MG PO TABS
ORAL_TABLET | ORAL | Status: AC
  Administered 2017-08-09: 20 mg via ORAL
  Filled 2017-08-09: qty 1

## 2017-08-09 MED ORDER — DEXAMETHASONE SODIUM PHOSPHATE 10 MG/ML IJ SOLN
INTRAMUSCULAR | Status: AC
  Filled 2017-08-09: qty 1

## 2017-08-09 MED ORDER — ACETAMINOPHEN 500 MG PO TABS
ORAL_TABLET | ORAL | Status: AC
  Administered 2017-08-09: 1000 mg via ORAL
  Filled 2017-08-09: qty 2

## 2017-08-09 MED ORDER — LIDOCAINE HCL (PF) 1 % IJ SOLN
INTRAMUSCULAR | Status: AC
  Filled 2017-08-09: qty 30

## 2017-08-09 MED ORDER — SUCCINYLCHOLINE CHLORIDE 20 MG/ML IJ SOLN
INTRAMUSCULAR | Status: AC
  Filled 2017-08-09: qty 2

## 2017-08-09 MED ORDER — FENTANYL CITRATE (PF) 100 MCG/2ML IJ SOLN
25.0000 ug | INTRAMUSCULAR | Status: DC | PRN
  Administered 2017-08-09 (×2): 25 ug via INTRAVENOUS

## 2017-08-09 MED ORDER — ONDANSETRON HCL 4 MG/2ML IJ SOLN
INTRAMUSCULAR | Status: DC | PRN
  Administered 2017-08-09: 4 mg via INTRAVENOUS

## 2017-08-09 SURGICAL SUPPLY — 34 items
APPLIER CLIP ROT 10 11.4 M/L (STAPLE) ×3
CHLORAPREP W/TINT 26ML (MISCELLANEOUS) ×3 IMPLANT
CLIP APPLIE ROT 10 11.4 M/L (STAPLE) ×1 IMPLANT
DECANTER SPIKE VIAL GLASS SM (MISCELLANEOUS) IMPLANT
DERMABOND ADVANCED (GAUZE/BANDAGES/DRESSINGS) ×2
DERMABOND ADVANCED .7 DNX12 (GAUZE/BANDAGES/DRESSINGS) ×1 IMPLANT
DRESSING SURGICEL FIBRLLR 1X2 (HEMOSTASIS) IMPLANT
DRSG SURGICEL FIBRILLAR 1X2 (HEMOSTASIS)
ELECT REM PT RETURN 9FT ADLT (ELECTROSURGICAL) ×3
ELECTRODE REM PT RTRN 9FT ADLT (ELECTROSURGICAL) ×1 IMPLANT
GLOVE BIO SURGEON STRL SZ7 (GLOVE) ×3 IMPLANT
GLOVE BIOGEL PI IND STRL 7.5 (GLOVE) ×1 IMPLANT
GLOVE BIOGEL PI INDICATOR 7.5 (GLOVE) ×2
GOWN STRL REUS W/ TWL LRG LVL3 (GOWN DISPOSABLE) ×3 IMPLANT
GOWN STRL REUS W/TWL LRG LVL3 (GOWN DISPOSABLE) ×6
GRASPER SUT TROCAR 14GX15 (MISCELLANEOUS) ×3 IMPLANT
IRRIGATION STRYKERFLOW (MISCELLANEOUS) IMPLANT
IRRIGATOR STRYKERFLOW (MISCELLANEOUS)
IV NS 1000ML (IV SOLUTION)
IV NS 1000ML BAXH (IV SOLUTION) IMPLANT
KIT TURNOVER KIT A (KITS) ×3 IMPLANT
NEEDLE HYPO 22GX1.5 SAFETY (NEEDLE) ×3 IMPLANT
NEEDLE INSUFFLATION 14GA 120MM (NEEDLE) ×3 IMPLANT
NS IRRIG 1000ML POUR BTL (IV SOLUTION) ×3 IMPLANT
PACK LAP CHOLECYSTECTOMY (MISCELLANEOUS) ×3 IMPLANT
POUCH SPECIMEN RETRIEVAL 10MM (ENDOMECHANICALS) ×3 IMPLANT
SCISSORS METZENBAUM CVD 33 (INSTRUMENTS) IMPLANT
SLEEVE ENDOPATH XCEL 5M (ENDOMECHANICALS) ×6 IMPLANT
SUT MNCRL AB 4-0 PS2 18 (SUTURE) ×3 IMPLANT
SUT VICRYL 0 UR6 27IN ABS (SUTURE) IMPLANT
SUT VICRYL AB 3-0 FS1 BRD 27IN (SUTURE) ×3 IMPLANT
TROCAR XCEL NON-BLD 11X100MML (ENDOMECHANICALS) ×3 IMPLANT
TROCAR XCEL NON-BLD 5MMX100MML (ENDOMECHANICALS) ×3 IMPLANT
TUBING INSUFFLATION (TUBING) ×3 IMPLANT

## 2017-08-09 NOTE — Interval H&P Note (Signed)
History and Physical Interval Note:  08/09/2017 11:28 AM  Joan Alexander  has presented today for surgery, with the diagnosis of cholelithiasis  The various methods of treatment have been discussed with the patient and family. After consideration of risks, benefits and other options for treatment, the patient has consented to  Procedure(s): LAPAROSCOPIC CHOLECYSTECTOMY (N/A) as a surgical intervention .  The patient's history has been reviewed, patient examined, no change in status, stable for surgery.  I have reviewed the patient's chart and labs.  Questions were answered to the patient's satisfaction.     Ancil LinseyJason Evan Lyndell Gillyard

## 2017-08-09 NOTE — Anesthesia Post-op Follow-up Note (Signed)
Anesthesia QCDR form completed.        

## 2017-08-09 NOTE — Anesthesia Postprocedure Evaluation (Signed)
Anesthesia Post Note  Patient: Joan Alexander  Procedure(s) Performed: LAPAROSCOPIC CHOLECYSTECTOMY (N/A Abdomen)  Patient location during evaluation: PACU Anesthesia Type: General Level of consciousness: awake and alert Pain management: pain level controlled Vital Signs Assessment: post-procedure vital signs reviewed and stable Respiratory status: spontaneous breathing, nonlabored ventilation, respiratory function stable and patient connected to nasal cannula oxygen Cardiovascular status: blood pressure returned to baseline and stable Postop Assessment: no apparent nausea or vomiting Anesthetic complications: no     Last Vitals:  Vitals:   08/09/17 1423 08/09/17 1430  BP:  111/67  Pulse: 81 79  Resp: 17 16  Temp:  (!) 36.4 C  SpO2: 96% 98%    Last Pain:  Vitals:   08/09/17 1441  TempSrc:   PainSc: 10-Worst pain ever                 Anzleigh Slaven S

## 2017-08-09 NOTE — Anesthesia Procedure Notes (Signed)
Procedure Name: Intubation Date/Time: 08/09/2017 11:41 AM Performed by: Lily KocherPeralta, Essense Bousquet, CRNA Pre-anesthesia Checklist: Patient identified, Patient being monitored, Timeout performed, Emergency Drugs available and Suction available Patient Re-evaluated:Patient Re-evaluated prior to induction Oxygen Delivery Method: Circle system utilized Preoxygenation: Pre-oxygenation with 100% oxygen Induction Type: IV induction Ventilation: Mask ventilation without difficulty Laryngoscope Size: 3 and McGraph Grade View: Grade I Tube type: Oral Tube size: 7.0 mm Number of attempts: 1 Airway Equipment and Method: Stylet Placement Confirmation: ETT inserted through vocal cords under direct vision,  positive ETCO2 and breath sounds checked- equal and bilateral Secured at: 22 cm Tube secured with: Tape Dental Injury: Teeth and Oropharynx as per pre-operative assessment  Comments: McGrath used electively

## 2017-08-09 NOTE — Addendum Note (Signed)
Addendum  created 08/09/17 1631 by Stormy Fabianurtis, Kenshin Splawn, CRNA   Charge Capture section accepted

## 2017-08-09 NOTE — OR Nursing (Signed)
Discharge instructions discussed with pt and brother. Both voice understanding.

## 2017-08-09 NOTE — Anesthesia Preprocedure Evaluation (Addendum)
Anesthesia Evaluation  Patient identified by MRN, date of birth, ID band Patient awake    Reviewed: Allergy & Precautions, NPO status , Patient's Chart, lab work & pertinent test results, reviewed documented beta blocker date and time   Airway Mallampati: III  TM Distance: >3 FB     Dental  (+) Chipped   Pulmonary           Cardiovascular hypertension, Pt. on medications      Neuro/Psych    GI/Hepatic   Endo/Other  diabetes, Type 2  Renal/GU      Musculoskeletal   Abdominal   Peds  Hematology   Anesthesia Other Findings Obese.  Reproductive/Obstetrics                            Anesthesia Physical Anesthesia Plan  ASA: III  Anesthesia Plan: General   Post-op Pain Management:    Induction: Intravenous  PONV Risk Score and Plan:   Airway Management Planned: Oral ETT  Additional Equipment:   Intra-op Plan:   Post-operative Plan:   Informed Consent: I have reviewed the patients History and Physical, chart, labs and discussed the procedure including the risks, benefits and alternatives for the proposed anesthesia with the patient or authorized representative who has indicated his/her understanding and acceptance.     Plan Discussed with: CRNA  Anesthesia Plan Comments:         Anesthesia Quick Evaluation

## 2017-08-09 NOTE — Transfer of Care (Signed)
Immediate Anesthesia Transfer of Care Note  Patient: Joan Alexander  Procedure(s) Performed: LAPAROSCOPIC CHOLECYSTECTOMY (N/A Abdomen)  Patient Location: PACU  Anesthesia Type:General  Level of Consciousness: awake  Airway & Oxygen Therapy: Patient Spontanous Breathing  Post-op Assessment: Report given to RN  Post vital signs: stable  Last Vitals:  Vitals Value Taken Time  BP 160/79 08/09/2017  1:37 PM  Temp 36.4 C 08/09/2017  1:37 PM  Pulse 86 08/09/2017  1:37 PM  Resp 24 08/09/2017  1:37 PM  SpO2 96 % 08/09/2017  1:37 PM    Last Pain:  Vitals:   08/09/17 1337  TempSrc:   PainSc: Asleep         Complications: No apparent anesthesia complications

## 2017-08-09 NOTE — Op Note (Signed)
SURGICAL OPERATIVE REPORT   DATE OF PROCEDURE: 08/09/2017  ATTENDING Surgeon(s): Ancil Linseyavis, Annalysia Willenbring Evan, MD  ANESTHESIA: GETA  PRE-OPERATIVE DIAGNOSIS: Symptomatic Cholelithiasis (K80.20)  POST-OPERATIVE DIAGNOSIS: Chronic cholecystitis (K81.1)  PROCEDURE(S): (cpt's: 47562) 1.) Laparoscopic Cholecystectomy  INTRAOPERATIVE FINDINGS: Moderate pericholecystic inflammation with minimal gallbladder wall thickening, but significant pericholecystic adhesions, particularly around the cystic duct and infundibum; cystic duct and cystic artery clips well-secured, hemostasis at completion of procedure  INTRAOPERATIVE FLUIDS: 900 mL crystalloid   ESTIMATED BLOOD LOSS: Minimal (<30 mL)   URINE OUTPUT: No foley  SPECIMENS: Gallbladder  IMPLANTS: None  DRAINS: None   COMPLICATIONS: None apparent   CONDITION AT COMPLETION: Hemodynamically stable and extubated  DISPOSITION: PACU   INDICATION(S) FOR PROCEDURE:  Patient is a 32 y.o. female who recently presented with post-prandial RUQ > epigastric abdominal pain after eating fatty foods in particular. Ultrasound suggested cholelithiasis without sonographic evidence of cholecystitis. All risks, benefits, and alternatives to above elective procedures were discussed with the patient, who elected to proceed, and informed consent was accordingly obtained at that time.   DETAILS OF PROCEDURE:  Patient was brought to the operating suite and appropriately identified. General anesthesia was administered along with peri-operative prophylactic IV antibiotics, and endotracheal intubation was performed by anesthesiologist, along with NG/OG tube for gastric decompression. In supine position, operative site was prepped and draped in usual sterile fashion, and following a brief time out, initial 5 mm incision was made in a natural skin crease just above the umbilicus. Fascia was then elevated, and a Verress needle was inserted and its proper position confirmed  using aspiration and saline meniscus test.  Upon insufflation of the abdominal cavity with carbon dioxide to a well-tolerated pressure of 12-15 mmHg, 5 mm peri-umbilical port followed by laparoscope were inserted and used to inspect the abdominal cavity and its contents with no injuries from insertion of the first trochar noted. Three additional trocars were inserted, one at the epigastric position (10 mm) and two along the Right costal margin (5 mm). The table was then placed in reverse Trendelenburg position with the Right side up. Filmy adhesions between the gallbladder and omentum/duodenum/transverse colon were lysed using combined blunt dissection and selective electrocautery. The apex/dome of the gallbladder was grasped with an atraumatic grasper passed through the lateral port and retracted apically over the liver. The infundibulum was also grasped and retracted, exposing Calot's triangle. The peritoneum overlying the gallbladder infundibulum was incised and dissected free of surrounding peritoneal attachments, revealing the cystic duct and cystic artery, which were clipped twice on the patient side and once on the gallbladder specimen side close to the gallbladder. The gallbladder was then dissected from its peritoneal attachments to the liver using electrocautery, and the gallbladder was placed into a laparoscopic specimen bag and removed from the abdominal cavity via the epigastric port site. Hemostasis and secure placement of clips were confirmed, and intra-peritoneal cavity was inspected with no additional findings. PMI laparoscopic fascial closure device was then used to re-approximate fascia at the 10 mm epigastric port site.  All ports were then removed under direct visualization, and abdominal cavity was desuflated. All port sites were irrigated/cleaned, additional local anesthetic was injected at each incision, 3-0 Vicryl was used to re-approximate dermis at 10 mm port site(s), and subcuticular  4-0 Monocryl suture was used to re-approximate skin. Skin was then cleaned, dried, and sterile skin glue was applied. Patient was then safely able to be awakened, extubated, and transferred to PACU for post-operative monitoring and care.   I  was present for all aspects of the above procedure, and no operative complications were apparent.

## 2017-08-09 NOTE — Discharge Instructions (Addendum)
In addition to included general post-operative instructions for Laparoscopic Cholecystectomy,  Diet: Resume home heart healthy diet (as discussed, may want to start with low-fat foods).   Activity: No heavy lifting >20 pounds (children, pets, laundry, garbage) or strenuous activity until follow-up, but light activity and walking are encouraged. Do not drive or drink alcohol if taking narcotic pain medications.  Wound care: 2 days after surgery (Wednesday, 7/31), you may shower/get incision wet with soapy water and pat dry (do not rub incisions), but no baths or submerging incision underwater until follow-up.   Medications: Resume all home medications. For mild to moderate pain: acetaminophen (Tylenol)or ibuprofen/naproxen (if no kidney disease). Combining Tylenol with alcohol can substantially increase your risk of causing liver disease. Narcotic pain medications, if prescribed, can be used for severe pain, though may cause nausea, constipation, and drowsiness. Do not combine Tylenol and Percocet (or similar) within a 6 hour period as Percocet (and similar) contain(s) Tylenol. If you do not need the narcotic pain medication, you do not need to fill the prescription.  Call office [phone]) at any time if any questions, worsening pain, fevers/chills, bleeding, drainage from incision site, or other concerns.    AMBULATORY SURGERY  DISCHARGE INSTRUCTIONS   1) The drugs that you were given will stay in your system until tomorrow so for the next 24 hours you should not:  A) Drive an automobile B) Make any legal decisions C) Drink any alcoholic beverage   2) You may resume regular meals tomorrow.  Today it is better to start with liquids and gradually work up to solid foods.  You may eat anything you prefer, but it is better to start with liquids, then soup and crackers, and gradually work up to solid foods.   3) Please notify your doctor immediately if you have any unusual bleeding,  trouble breathing, redness and pain at the surgery site, drainage, fever, or pain not relieved by medication.    4) Additional Instructions:        Please contact your physician with any problems or Same Day Surgery at [phone], Monday through Friday 6 am to 4 pm, or [place] at [facility] Main number at [phone].

## 2017-08-11 LAB — SURGICAL PATHOLOGY

## 2017-08-16 DIAGNOSIS — K811 Chronic cholecystitis: Secondary | ICD-10-CM

## 2017-08-18 ENCOUNTER — Other Ambulatory Visit: Payer: Self-pay

## 2017-08-19 ENCOUNTER — Encounter: Payer: Self-pay | Admitting: Surgery

## 2017-08-19 ENCOUNTER — Ambulatory Visit (INDEPENDENT_AMBULATORY_CARE_PROVIDER_SITE_OTHER): Payer: Medicaid Other | Admitting: Surgery

## 2017-08-19 DIAGNOSIS — Z4889 Encounter for other specified surgical aftercare: Secondary | ICD-10-CM

## 2017-08-19 DIAGNOSIS — K811 Chronic cholecystitis: Secondary | ICD-10-CM

## 2017-08-19 NOTE — Patient Instructions (Signed)
Please give us a call in case you have any questions or concerns.  We will see you back next week to make sure that you get better.  Please take Tylenol 1000 MG every 6 hours and Ibuprofen 800 MG every 8 hours for pain.

## 2017-08-23 ENCOUNTER — Encounter: Payer: Self-pay | Admitting: Surgery

## 2017-08-23 NOTE — Progress Notes (Signed)
Surgical Clinic Progress/Follow-up Note   HPI:  32 y.o. Female presents to clinic for early post-op follow-up 10 Days s/p laparoscopic cholecystectomy Earlene Plater(Teryn Boerema, 08/09/2017). Patient reports gradually improving mild Right flank pain (away from incisions) and otherwise complete resolution of pre-operative pain, has been tolerating regular diet with +flatus and increasingly normal BM's, denies N/V, fever/chills, CP, or SOB.  Review of Systems:  Constitutional: denies fever/chills  Respiratory: denies shortness of breath, wheezing  Cardiovascular: denies chest pain, palpitations  Gastrointestinal: abdominal pain, N/V, and bowel function as per interval history Skin: Denies any other rashes or skin discolorations except post-surgical wounds as per interval history  Vital Signs:  There were no vitals taken for this visit.   Physical Exam:  Constitutional:  -- Normal  -- Awake, alert, and oriented x3  Pulmonary:  -- No crackles -- Equal breath sounds bilaterally -- Breathing non-labored at rest Cardiovascular:  -- S1, S2 present  -- No pericardial rubs  Gastrointestinal:  -- Soft and non-distended with mild mild Right flank tenderness to deep palpation, no significant peri-incisional tenderness to palpation, no guarding/rebound tenderness -- Post-surgical incisions all well-approximated without any peri-incisional erythema or drainage -- No abdominal masses appreciated, pulsatile or otherwise  Musculoskeletal / Integumentary:  -- Wounds or skin discoloration: None appreciated except post-surgical incisions as described above (GI) -- Extremities: B/L UE and LE FROM, hands and feet warm, no edema   Assessment:  32 y.o. yo Female with a problem list including...  Patient Active Problem List   Diagnosis Date Noted  . Chronic cholecystitis   . Atypical chest pain 07/30/2017  . SOB (shortness of breath) 07/30/2017  . Symptomatic cholelithiasis 07/14/2017    presents to clinic for  post-op follow-up evaluation, doing overall well 10 Days s/p laparoscopic cholecystectomy Earlene Plater(Donnica Jarnagin, 08/09/2017) for chronic calculous cholecystitis.  Plan:              - advance diet as tolerated              - okay in 4 days to submerge incisions under water (baths, swimming) prn             - gradually resume all activities after 4 more days without restrictions over next 2 weeks             - apply sunblock particularly to incisions with sun exposure to reduce pigmentation of scars             - will keep scheduled appointment for next week, but patient may cancel prn  - instructed to call office if any questions or concerns  All of the above recommendations were discussed with the patient, and all of patient's questions were answered to her expressed satisfaction.  -- Scherrie GerlachJason E. Earlene Plateravis, MD, RPVI [place]: [facility] - Partnering for exceptional care. Office: [phone]

## 2017-08-24 ENCOUNTER — Encounter: Payer: Medicaid Other | Admitting: Surgery

## 2017-08-24 ENCOUNTER — Encounter: Payer: Self-pay | Admitting: Surgery

## 2017-08-24 ENCOUNTER — Ambulatory Visit (INDEPENDENT_AMBULATORY_CARE_PROVIDER_SITE_OTHER): Payer: Medicaid Other | Admitting: Surgery

## 2017-08-24 VITALS — BP 166/117 | HR 93 | Temp 98.5°F | Ht 63.0 in | Wt 212.6 lb

## 2017-08-24 DIAGNOSIS — Z4889 Encounter for other specified surgical aftercare: Secondary | ICD-10-CM

## 2017-08-24 NOTE — Progress Notes (Signed)
Surgical Clinic Progress/Follow-up Note   HPI:  32 y.o. Female presents to clinic for post-op follow-up 2 weeks s/p laparoscopic cholecystectomy Joan Alexander(Davis, 08/09/2017). Patient reports complete resolution of pre- and post- operative pain and has been tolerating regular diet with +flatus and normal BM's, denies N/V, fever/chills, CP, or SOB.  Review of Systems:  Constitutional: denies fever/chills  Respiratory: denies shortness of breath, wheezing  Cardiovascular: denies chest pain, palpitations  Gastrointestinal: abdominal pain, N/V, and bowel function as per interval history Skin: Denies any other rashes or skin discolorations except post-surgical wounds as per interval history  Vital Signs:  BP (!) 166/117   Pulse 93   Temp 98.5 F (36.9 C) (Oral)   Ht 5\' 3"  (1.6 m)   Wt 212 lb 9.6 oz (96.4 kg)   BMI 37.66 kg/m    Physical Exam:  Constitutional:  -- Obese body habitus  -- Awake, alert, and oriented x3  Pulmonary:  -- No crackles -- Equal breath sounds bilaterally -- Breathing non-labored at rest Cardiovascular:  -- S1, S2 present  -- No pericardial rubs  Gastrointestinal:  -- Soft and non-distended, non-tender to palpation, no guarding/rebound tenderness -- Post-surgical incisions all well-approximated without any peri-incisional erythema or drainage -- No abdominal masses appreciated, pulsatile or otherwise  Musculoskeletal / Integumentary:  -- Wounds or skin discoloration: None appreciated except post-surgical incisions as described above (GI) -- Extremities: B/L UE and LE FROM, hands and feet warm, no edema   Assessment:  32 y.o. yo Female with a problem list including...  Patient Active Problem List   Diagnosis Date Noted  . Atypical chest pain 07/30/2017  . SOB (shortness of breath) 07/30/2017    presents to clinic for post-op follow-up evaluation, doing well 2 weeks s/p laparoscopic cholecystectomy Joan Alexander(Davis, 08/09/2017) for chronic calculous  cholecystitis.  Plan:              - advance diet as tolerated              - okay to submerge incisions under water (baths, swimming) prn             - gradually resume all activities without restrictions over next 2 weeks             - apply sunblock particularly to incisions with sun exposure to reduce pigmentation of scars             - return to clinic as needed, instructed to call office if any questions or concerns  All of the above recommendations were discussed with the patient, and all of patient's questions were answered to her expressed satisfaction.  -- Scherrie GerlachJason E. Joan Plateravis, MD, RPVI [place]: [facility] - Partnering for exceptional care. Office: [phone]

## 2017-08-24 NOTE — Patient Instructions (Signed)

## 2018-01-09 ENCOUNTER — Emergency Department
Admission: EM | Admit: 2018-01-09 | Discharge: 2018-01-09 | Disposition: A | Discharge status: Home / Self Care | Payer: Medicaid Other | Attending: Emergency Medicine | Admitting: Emergency Medicine

## 2018-01-09 ENCOUNTER — Emergency Department: Payer: Medicaid Other

## 2018-01-09 ENCOUNTER — Other Ambulatory Visit: Payer: Self-pay

## 2018-01-09 DIAGNOSIS — I10 Essential (primary) hypertension: Secondary | ICD-10-CM | POA: Diagnosis not present

## 2018-01-09 DIAGNOSIS — Z7984 Long term (current) use of oral hypoglycemic drugs: Secondary | ICD-10-CM | POA: Insufficient documentation

## 2018-01-09 DIAGNOSIS — R079 Chest pain, unspecified: Secondary | ICD-10-CM | POA: Insufficient documentation

## 2018-01-09 DIAGNOSIS — R200 Anesthesia of skin: Secondary | ICD-10-CM | POA: Insufficient documentation

## 2018-01-09 DIAGNOSIS — E876 Hypokalemia: Secondary | ICD-10-CM

## 2018-01-09 DIAGNOSIS — E119 Type 2 diabetes mellitus without complications: Secondary | ICD-10-CM | POA: Diagnosis not present

## 2018-01-09 LAB — URINALYSIS, COMPLETE (UACMP) WITH MICROSCOPIC
BILIRUBIN URINE: NEGATIVE
Bacteria, UA: NONE SEEN
Glucose, UA: 50 mg/dL — AB
Hgb urine dipstick: NEGATIVE
Ketones, ur: NEGATIVE mg/dL
LEUKOCYTES UA: NEGATIVE
Nitrite: NEGATIVE
PH: 7 (ref 5.0–8.0)
Protein, ur: NEGATIVE mg/dL
SPECIFIC GRAVITY, URINE: 1.01 (ref 1.005–1.030)

## 2018-01-09 LAB — CBC
HCT: 41.7 % (ref 36.0–46.0)
Hemoglobin: 13.8 g/dL (ref 12.0–15.0)
MCH: 27.1 pg (ref 26.0–34.0)
MCHC: 33.1 g/dL (ref 30.0–36.0)
MCV: 81.9 fL (ref 80.0–100.0)
PLATELETS: 249 10*3/uL (ref 150–400)
RBC: 5.09 MIL/uL (ref 3.87–5.11)
RDW: 12.9 % (ref 11.5–15.5)
WBC: 9 10*3/uL (ref 4.0–10.5)
nRBC: 0 % (ref 0.0–0.2)

## 2018-01-09 LAB — BASIC METABOLIC PANEL
Anion gap: 5 (ref 5–15)
BUN: 10 mg/dL (ref 6–20)
CALCIUM: 8.2 mg/dL — AB (ref 8.9–10.3)
CO2: 29 mmol/L (ref 22–32)
CREATININE: 0.5 mg/dL (ref 0.44–1.00)
Chloride: 103 mmol/L (ref 98–111)
Glucose, Bld: 224 mg/dL — ABNORMAL HIGH (ref 70–99)
Potassium: 3.3 mmol/L — ABNORMAL LOW (ref 3.5–5.1)
SODIUM: 137 mmol/L (ref 135–145)

## 2018-01-09 LAB — POCT PREGNANCY, URINE: PREG TEST UR: NEGATIVE

## 2018-01-09 LAB — TROPONIN I

## 2018-01-09 MED ORDER — POTASSIUM CHLORIDE CRYS ER 20 MEQ PO TBCR
40.0000 meq | EXTENDED_RELEASE_TABLET | Freq: Once | ORAL | Status: AC
  Administered 2018-01-09: 40 meq via ORAL
  Filled 2018-01-09: qty 2

## 2018-01-09 NOTE — ED Provider Notes (Signed)
[facility] Emergency Department Provider Note   ____________________________________________   First MD Initiated Contact with Patient 01/09/18 0505     (approximate)  I have reviewed the triage vital signs and the nursing notes.   HISTORY  Chief Complaint Numbness and Dizziness    HPI Joan Alexander is a 32 y.o. female who presents to the ED from home with a chief complaint of left side numbness. Patient reports a several day history of numb feeling in her left lower leg and left forearm. Denies associated extremity weakness, bowel or bladder incontinence. Reports occasional chest pain. Denies headache, fever, chills, shortness of breath, abdominal pain, nausea or vomiting.  Denies recent travel or trauma.  Denies use of anticoagulants.   Past Medical History:  Diagnosis Date  . Chronic cholecystitis   . Diabetes mellitus without complication (HCC)   . Gallstones   . Hypertension     Patient Active Problem List   Diagnosis Date Noted  . Atypical chest pain 07/30/2017  . SOB (shortness of breath) 07/30/2017    Past Surgical History:  Procedure Laterality Date  . CHOLECYSTECTOMY N/A 08/09/2017   Procedure: LAPAROSCOPIC CHOLECYSTECTOMY;  Surgeon: Ancil Linseyavis, Jason Evan, MD;  Location: [facility] ORS;  Service: General;  Laterality: N/A;  . TONSILLECTOMY AND ADENOIDECTOMY Bilateral 02/11/2017   Procedure: TONSILLECTOMY;  Surgeon: Bud FaceVaught, Creighton, MD;  Location: [facility] ORS;  Service: ENT;  Laterality: Bilateral;    Prior to Admission medications   Medication Sig Start Date End Date Taking? Authorizing Provider  amLODipine (NORVASC) 5 MG tablet Take 5 mg by mouth every evening.     [provider]  hydrochlorothiazide (HYDRODIURIL) 25 MG tablet Take 25 mg by mouth every evening.  01/26/17   [provider]  metFORMIN (GLUCOPHAGE-XR) 500 MG 24 hr tablet Take 1,000 mg by mouth every evening.     [provider]  montelukast (SINGULAIR)  10 MG tablet TK 1 T PO D FOR ASTHMA OR ALLERGIES 06/25/17   [provider]  Multiple Vitamin (MULTIVITAMIN WITH MINERALS) TABS tablet Take 1 tablet by mouth daily.    [provider]  PROAIR HFA 108 [phone](90 Base) MCG/ACT inhaler Inhale 2 puffs into the lungs every 4 (four) hours as needed for wheezing.  05/12/17   [provider]    Allergies Lisinopril  Family history None for autoimmune disease None for MS  Social History Social History   Tobacco Use  . Smoking status: Never Smoker  . Smokeless tobacco: Never Used  Substance Use Topics  . Alcohol use: No  . Drug use: No    Review of Systems  Constitutional: No fever/chills Eyes: No visual changes. ENT: No sore throat. Cardiovascular: Denies chest pain. Respiratory: Denies shortness of breath. Gastrointestinal: No abdominal pain.  No nausea, no vomiting.  No diarrhea.  No constipation. Genitourinary: Negative for dysuria. Musculoskeletal: Negative for back pain. Skin: Negative for rash. Neurological: Negative for headaches, focal weakness or numbness.   ____________________________________________   PHYSICAL EXAM:  VITAL SIGNS: ED Triage Vitals [01/09/18 0241]  Enc Vitals Group     BP      Pulse      Resp      Temp      Temp src      SpO2      Weight 219 lb (99.3 kg)     Height 5\' 3"  (1.6 m)     Head Circumference      Peak Flow      Pain Score  0     Pain Loc      Pain Edu?      Excl. in GC?     Constitutional: Alert and oriented. Well appearing and in no acute distress. Eyes: Conjunctivae are normal. PERRL. EOMI. Head: Atraumatic. Nose: No congestion/rhinnorhea. Mouth/Throat: Mucous membranes are moist.  Oropharynx non-erythematous. Neck: No stridor.  No cervical spine tenderness to palpation. Cardiovascular: Normal rate, regular rhythm. Grossly normal heart sounds.  Good peripheral circulation. Respiratory: Normal respiratory effort.  No retractions. Lungs  CTAB. Gastrointestinal: Soft and nontender. No distention. No abdominal bruits. No CVA tenderness. Musculoskeletal: No lower extremity tenderness nor edema.  No joint effusions. Neurologic: Alert and oriented x3.  CN II days XII grossly intact.  Normal speech and language. No gross focal neurologic deficits are appreciated. 5/5 motor strength and sensation.  Skin:  Skin is warm, dry and intact. No rash noted. Psychiatric: Mood and affect are normal. Speech and behavior are normal.  ____________________________________________   LABS (all labs ordered are listed, but only abnormal results are displayed)  Labs Reviewed  BASIC METABOLIC PANEL - Abnormal; Notable for the following components:      Result Value   Potassium 3.3 (*)    Glucose, Bld 224 (*)    Calcium 8.2 (*)    All other components within normal limits  URINALYSIS, COMPLETE (UACMP) WITH MICROSCOPIC - Abnormal; Notable for the following components:   Color, Urine STRAW (*)    APPearance CLEAR (*)    Glucose, UA 50 (*)    All other components within normal limits  CBC  TROPONIN I  POCT PREGNANCY, URINE  POC URINE PREG, ED   ____________________________________________  EKG  ED ECG REPORT I, SUNG,JADE J, the attending physician, personally viewed and interpreted this ECG.   Date: 01/09/2018  EKG Time: 0526  Rate: 75  Rhythm: normal EKG, normal sinus rhythm  Axis: Normal  Intervals:none  ST&T Change: Nonspecific  ____________________________________________  RADIOLOGY  ED MD interpretation:  No ICH; MRI brain unremarkable; mild lumbar spine with L5-S1 facet arthritis  Official radiology report(s): Ct Head Wo Contrast  Result Date: 01/09/2018 CLINICAL DATA:  Acute onset of dizziness and nausea. Left arm heaviness. Left-sided numbness. EXAM: CT HEAD WITHOUT CONTRAST TECHNIQUE: Contiguous axial images were obtained from the base of the skull through the vertex without intravenous contrast. COMPARISON:  CTA  head and MRI of the brain performed 12/31/2016 FINDINGS: Brain: No evidence of acute infarction, hemorrhage, hydrocephalus, extra-axial collection or mass lesion/mass effect. The posterior fossa, including the cerebellum, brainstem and fourth ventricle, is within normal limits. The third and lateral ventricles, and basal ganglia are unremarkable in appearance. The cerebral hemispheres are symmetric in appearance, with normal gray-white differentiation. No mass effect or midline shift is seen. Vascular: No hyperdense vessel or unexpected calcification. Skull: There is no evidence of fracture; visualized osseous structures are unremarkable in appearance. Sinuses/Orbits: The visualized portions of the orbits are within normal limits. The paranasal sinuses and mastoid air cells are well-aerated. Other: No significant soft tissue abnormalities are seen. IMPRESSION: Unremarkable noncontrast CT of the head. Electronically Signed   By: Roanna RaiderJeffery  Chang M.D.   On: 01/09/2018 03:10   Mr Brain Wo Contrast  Result Date: 01/09/2018 CLINICAL DATA:  Left-sided numbness EXAM: MRI HEAD WITHOUT CONTRAST TECHNIQUE: Multiplanar, multiecho pulse sequences of the brain and surrounding structures were obtained without intravenous contrast. COMPARISON:  12/31/2016 FINDINGS: Brain: No infarction, hemorrhage, hydrocephalus, extra-axial collection or mass lesion. No white matter disease Vascular: Normal intracranial  flow voids. Skull and upper cervical spine: Normal marrow signal. Sinuses/Orbits: Negative Other: Motion degraded study requiring fast brain protocol. Enlarged cervical lymph nodes and lingual tonsil, see sagittal FLAIR imaging. Tonsillar thickening was more extensive on prior, there has been interval tonsillectomy. IMPRESSION: 1. Motion degraded exam with normal appearance of the brain. 2. Symmetric enlargement of cervical lymph nodes, please correlate for upper respiratory tract infection. Electronically Signed   By: Marnee Spring M.D.   On: 01/09/2018 07:10   Mr Lumbar Spine Wo Contrast  Result Date: 01/09/2018 CLINICAL DATA:  Low back pain and left leg numbness EXAM: MRI LUMBAR SPINE WITHOUT CONTRAST TECHNIQUE: Multiplanar, multisequence MR imaging of the lumbar spine was performed. No intravenous contrast was administered. COMPARISON:  None. FINDINGS: Segmentation:  Standard. Alignment:  Grade 1 anterolisthesis at L5-S1, facet mediated Vertebrae:  No fracture, evidence of discitis, or bone lesion. Conus medullaris and cauda equina: Conus extends to the L1 level. Conus and cauda equina appear normal. Paraspinal and other soft tissues: Negative. Disc levels: L5-S1: Facet arthropathy with spurring, sclerosis, and minor marrow edema. There is associated mild anterolisthesis. Relative disc narrowing and desiccation at this level with mild bulge. Moderate left foraminal stenosis. The canal and right foramen are patent IMPRESSION: L5-S1 facet arthritis with anterolisthesis and moderate left foraminal stenosis. Electronically Signed   By: Marnee Spring M.D.   On: 01/09/2018 06:53    ____________________________________________   PROCEDURES  Procedure(s) performed: None  Procedures  Critical Care performed: No  ____________________________________________   INITIAL IMPRESSION / ASSESSMENT AND PLAN / ED COURSE  As part of my medical decision making, I reviewed the following data within the electronic MEDICAL RECORD NUMBER Nursing notes reviewed and incorporated, Labs reviewed, EKG interpreted, Old chart reviewed, Radiograph reviewed and Notes from prior ED visits   31 year old female with diabetes and hypertension who presents with a several day history of left side numbness and occasional chest pain.  Differential diagnosis includes but is not limited to CVA, TIA, MS, CAD, etc.  Laboratory and CT results unremarkable.  Will proceed with MRI brain as well as lumbar spine.  Clinical Course as of Jan 10 719  Wynelle Link  Jan 09, 2018  0521 I personally visualized patient's old records and see that she had an ED visit exactly 1 year ago for similar complaints of left-sided numbness.  At that time she had a normal brain MRI.   [JS]  1610 Updated patient on MRI results.  She will follow up with her PCP.  Strict return precautions given.  Patient verbalizes understanding and agrees with plan of care.   [JS]    Clinical Course User Index [JS] Irean Hong, MD     ____________________________________________   FINAL CLINICAL IMPRESSION(S) / ED DIAGNOSES  Final diagnoses:  Numbness  Hypokalemia     ED Discharge Orders    None       Note:  This document was prepared using Dragon voice recognition software and may include unintentional dictation errors.    Irean Hong, MD 01/09/18 [phone]

## 2018-01-09 NOTE — ED Triage Notes (Signed)
Reports being dizzy and nauseated all day.  Reports noticed left arm feeling heavy and weight down in upper arm and no feeling in forearm for past 30 minutes.  Reports "the other day (Thursday) leg and foot were numb.

## 2018-01-09 NOTE — ED Notes (Signed)
Pt changing for MRI.

## 2018-01-09 NOTE — ED Notes (Signed)
Assessment: pt states had left leg numbness and tingling for several days that has improved today. Pt states she has had left arm numbness and tingling with radiation to left chest. Pt with symmetrical face, even grips. Pt does have some difficulty lifting left leg and holding straight for 5 seconds. Leg drifts but does not strike bed. No ptosis noted. Pt denies fever, known neck injury.

## 2018-01-09 NOTE — Discharge Instructions (Addendum)
Continue taking your medicines as directed by your doctor.  Return to the ER for worsening symptoms, persistent vomiting, difficulty breathing or other concerns.

## 2018-01-09 NOTE — ED Notes (Signed)
Report to tom and teresa, rn.  

## 2018-01-09 NOTE — ED Notes (Signed)
Spoke with Dr. Manson PasseyBrown regarding patient and order received.

## 2018-09-11 ENCOUNTER — Other Ambulatory Visit: Payer: Self-pay

## 2018-09-11 ENCOUNTER — Emergency Department: Payer: Medicaid Other

## 2018-09-11 ENCOUNTER — Emergency Department
Admission: EM | Admit: 2018-09-11 | Discharge: 2018-09-11 | Disposition: A | Discharge status: Home / Self Care | Payer: Medicaid Other | Attending: Emergency Medicine | Admitting: Emergency Medicine

## 2018-09-11 ENCOUNTER — Encounter: Payer: Self-pay | Admitting: Emergency Medicine

## 2018-09-11 DIAGNOSIS — E119 Type 2 diabetes mellitus without complications: Secondary | ICD-10-CM | POA: Diagnosis not present

## 2018-09-11 DIAGNOSIS — R1012 Left upper quadrant pain: Secondary | ICD-10-CM | POA: Insufficient documentation

## 2018-09-11 DIAGNOSIS — Z79899 Other long term (current) drug therapy: Secondary | ICD-10-CM | POA: Diagnosis not present

## 2018-09-11 DIAGNOSIS — I10 Essential (primary) hypertension: Secondary | ICD-10-CM | POA: Insufficient documentation

## 2018-09-11 LAB — POCT PREGNANCY, URINE: Preg Test, Ur: NEGATIVE

## 2018-09-11 LAB — URINALYSIS, COMPLETE (UACMP) WITH MICROSCOPIC
Bilirubin Urine: NEGATIVE
Glucose, UA: NEGATIVE mg/dL
Hgb urine dipstick: NEGATIVE
Ketones, ur: NEGATIVE mg/dL
Leukocytes,Ua: NEGATIVE
Nitrite: NEGATIVE
Protein, ur: NEGATIVE mg/dL
Specific Gravity, Urine: 1.013 (ref 1.005–1.030)
pH: 6 (ref 5.0–8.0)

## 2018-09-11 LAB — HEPATIC FUNCTION PANEL
ALT: 18 U/L (ref 0–44)
AST: 17 U/L (ref 15–41)
Albumin: 4 g/dL (ref 3.5–5.0)
Alkaline Phosphatase: 50 U/L (ref 38–126)
Bilirubin, Direct: 0.1 mg/dL (ref 0.0–0.2)
Indirect Bilirubin: 0.5 mg/dL (ref 0.3–0.9)
Total Bilirubin: 0.6 mg/dL (ref 0.3–1.2)
Total Protein: 7.8 g/dL (ref 6.5–8.1)

## 2018-09-11 LAB — LIPASE, BLOOD: Lipase: 30 U/L (ref 11–51)

## 2018-09-11 LAB — BASIC METABOLIC PANEL WITH GFR
Anion gap: 6 (ref 5–15)
BUN: 12 mg/dL (ref 6–20)
CO2: 25 mmol/L (ref 22–32)
Calcium: 8.8 mg/dL — ABNORMAL LOW (ref 8.9–10.3)
Chloride: 107 mmol/L (ref 98–111)
Creatinine, Ser: 0.57 mg/dL (ref 0.44–1.00)
GFR calc Af Amer: >60 mL/min (ref >60)
GFR calc non Af Amer: >60 mL/min (ref >60)
Glucose, Bld: 157 mg/dL — ABNORMAL HIGH (ref 70–99)
Potassium: 3.8 mmol/L (ref 3.5–5.1)
Sodium: 138 mmol/L (ref 135–145)

## 2018-09-11 LAB — CBC
HCT: 41.7 % (ref 36.0–46.0)
Hemoglobin: 13.8 g/dL (ref 12.0–15.0)
MCH: 27.5 pg (ref 26.0–34.0)
MCHC: 33.1 g/dL (ref 30.0–36.0)
MCV: 83.1 fL (ref 80.0–100.0)
Platelets: 250 K/uL (ref 150–400)
RBC: 5.02 MIL/uL (ref 3.87–5.11)
RDW: 13.4 % (ref 11.5–15.5)
WBC: 5.6 K/uL (ref 4.0–10.5)
nRBC: 0 % (ref 0.0–0.2)

## 2018-09-11 LAB — TROPONIN I (HIGH SENSITIVITY)
Troponin I (High Sensitivity): 4 ng/L (ref <18)
Troponin I (High Sensitivity): 3 ng/L (ref <18)

## 2018-09-11 MED ORDER — AMLODIPINE BESYLATE 5 MG PO TABS
10.0000 mg | ORAL_TABLET | Freq: Once | ORAL | Status: AC
  Administered 2018-09-11: 10 mg via ORAL
  Filled 2018-09-11: qty 2

## 2018-09-11 MED ORDER — ALUM & MAG HYDROXIDE-SIMETH 200-200-20 MG/5ML PO SUSP
15.0000 mL | Freq: Once | ORAL | Status: AC
  Administered 2018-09-11: 13:00:00 15 mL via ORAL
  Filled 2018-09-11: qty 30

## 2018-09-11 MED ORDER — FAMOTIDINE 20 MG PO TABS
20.0000 mg | ORAL_TABLET | Freq: Once | ORAL | Status: AC
  Administered 2018-09-11: 20 mg via ORAL
  Filled 2018-09-11: qty 1

## 2018-09-11 NOTE — ED Notes (Signed)
Pt in xray

## 2018-09-11 NOTE — ED Provider Notes (Signed)
[facility] Emergency Department Provider Note ____________________________________________   First MD Initiated Contact with Patient 09/11/18 1208     (approximate)  I have reviewed the triage vital signs and the nursing notes.   HISTORY  Chief Complaint Chest Pain  HPI Joan Alexander is a 33 y.o. female history of diabetes, hypertension previous cholecystitis  Patient reports that for about a week now she is been experiencing intermittent discomfort in her left upper abdomen.  Is located in the left up, it has sometimes a crampy component, then also a sharp component.  It comes and goes.  Not really associated with anything.  Not worse with eating.  Has not noticed any change with bending or movement.  Right now with some mild discomfort.  She started taking Nexium a day or 2 ago has not noticed any improvement.  No fevers.  No exposure to coronavirus.  No nausea or vomiting.  No black or bloody stools.  No diarrhea.  Patient reports that she is not having chest pain.  Triage note did not she is having chest pain, she reports is actually pain in the left upper quadrant and not in her chest.  No shortness of breath  Seeing her primary care doctor, they have recently increased her Norvasc to 10 mg which she takes early afternoon daily, has not yet had it today.  She is following closely with her doctor for elevated blood pressure she reports as high as 190 at 1 visit   Past Medical History:  Diagnosis Date  . Chronic cholecystitis   . Diabetes mellitus without complication (HCC)   . Gallstones   . Hypertension     Patient Active Problem List   Diagnosis Date Noted  . Atypical chest pain 07/30/2017  . SOB (shortness of breath) 07/30/2017    Past Surgical History:  Procedure Laterality Date  . CHOLECYSTECTOMY N/A 08/09/2017   Procedure: LAPAROSCOPIC CHOLECYSTECTOMY;  Surgeon: Ancil Linsey, MD;  Location: [facility] ORS;  Service: General;  Laterality: N/A;   . TONSILLECTOMY AND ADENOIDECTOMY Bilateral 02/11/2017   Procedure: TONSILLECTOMY;  Surgeon: Bud Face, MD;  Location: [facility] ORS;  Service: ENT;  Laterality: Bilateral;    Prior to Admission medications   Medication Sig Start Date End Date Taking? Authorizing Provider  amLODipine (NORVASC) 10 MG tablet Take 10 mg by mouth every evening.    Yes [provider]  hydrochlorothiazide (HYDRODIURIL) 25 MG tablet Take 25 mg by mouth every evening.  01/26/17  Yes [provider]  PROAIR HFA 108 (90 Base) MCG/ACT inhaler Inhale 2 puffs into the lungs every 4 (four) hours as needed for wheezing.  05/12/17  Yes [provider]    Allergies Lisinopril  No family history on file.  Social History Social History   Tobacco Use  . Smoking status: Never Smoker  . Smokeless tobacco: Never Used  Substance Use Topics  . Alcohol use: No  . Drug use: No    Review of Systems Constitutional: No fever/chills Eyes: No visual changes. ENT: No sore throat. Cardiovascular: Denies chest pain. Respiratory: Denies shortness of breath. Gastrointestinal: See HPI. Genitourinary: Negative for dysuria.  Denies pregnancy.  No foul-smelling odor. Musculoskeletal: Negative for back pain except sometimes in the abdominal pain is present she gets a slight referral of the pain toward her left upper back. Skin: Negative for rash. Neurological: Negative for headaches, areas of focal weakness or numbness.    ____________________________________________   PHYSICAL EXAM:  VITAL SIGNS: ED Triage Vitals  Enc Vitals Group     BP 09/11/18 1040 (!) 163/98     Pulse Rate 09/11/18 1040 67     Resp 09/11/18 1040 18     Temp 09/11/18 1040 98.2 F (36.8 C)     Temp Source 09/11/18 1040 Oral     SpO2 09/11/18 1040 97 %     Weight 09/11/18 1042 227 lb (103 kg)     Height 09/11/18 1042 5\' 4"  (1.626 m)     Head Circumference --      Peak Flow --      Pain Score 09/11/18 1042 8     Pain  Loc --      Pain Edu? --      Excl. in GC? --     Constitutional: Alert and oriented. Well appearing and in no acute distress.  Very pleasant. Eyes: Conjunctivae are normal. Head: Atraumatic. Nose: No congestion/rhinnorhea. Mouth/Throat: Mucous membranes are moist. Neck: No stridor.  Cardiovascular: Normal rate, regular rhythm. Grossly normal heart sounds.  Good peripheral circulation. Respiratory: Normal respiratory effort.  No retractions. Lungs CTAB. Gastrointestinal: Soft and nontender liver symptoms very mild discomfort in the left upper quadrant, no rebound no guarding no peritonitis. No distention. Musculoskeletal: No lower extremity tenderness nor edema. Neurologic:  Normal speech and language. No gross focal neurologic deficits are appreciated.  Skin:  Skin is warm, dry and intact. No rash noted. Psychiatric: Mood and affect are normal. Speech and behavior are normal.  ____________________________________________   LABS (all labs ordered are listed, but only abnormal results are displayed)  Labs Reviewed  BASIC METABOLIC PANEL - Abnormal; Notable for the following components:      Result Value   Glucose, Bld 157 (*)    Calcium 8.8 (*)    All other components within normal limits  URINALYSIS, COMPLETE (UACMP) WITH MICROSCOPIC - Abnormal; Notable for the following components:   Color, Urine YELLOW (*)    APPearance HAZY (*)    Bacteria, UA RARE (*)    All other components within normal limits  CBC  HEPATIC FUNCTION PANEL  LIPASE, BLOOD  POC URINE PREG, ED  POCT PREGNANCY, URINE  TROPONIN I (HIGH SENSITIVITY)  TROPONIN I (HIGH SENSITIVITY)   ____________________________________________  EKG  Reviewed and interpreted at 1050 Heart rate 70 QRS 100 QTc 420 Normal sinus rhythm, probable left ventricular hypertrophy.  Isoelectric lead II.  Minimal nonspecific T wave abnormality.  No evidence of obvious acute ischemia.  Compared with June 30, 2017, no significant  changes are noted ____________________________________________  RADIOLOGY  Dg Chest Portable 1 View  Result Date: 09/11/2018 CLINICAL DATA:  Tachycardia. EXAM: PORTABLE CHEST 1 VIEW COMPARISON:  06/30/2017 FINDINGS: Heart size upper limits of normal. Mediastinal shadows normal. Pulmonary vascularity within normal limits. No infiltrate, collapse or effusion. IMPRESSION: No active disease. Electronically Signed   By: Paulina FusiMark  Shogry M.D.   On: 09/11/2018 12:31   Dg Abd 2 Views  Result Date: 09/11/2018 CLINICAL DATA:  Left upper quadrant abdominal pain for 2 weeks EXAM: ABDOMEN - 2 VIEW COMPARISON:  None. FINDINGS: No disproportionately dilated small bowel loops or air-fluid levels. Moderate to large colorectal stool volume. No evidence of pneumatosis or pneumoperitoneum. Clear lung bases. Cholecystectomy clips are seen in the right upper quadrant of the abdomen. No radiopaque nephrolithiasis. IMPRESSION: Nonobstructive bowel gas pattern. Moderate to large colorectal stool volume suggests constipation. Electronically Signed   By: Delbert PhenixJason A Poff M.D.   On: 09/11/2018 13:20    Imaging reviewed.  Clear  chest x-ray.  Possible constipation left ____________________________________________   PROCEDURES  Procedure(s) performed: None  Procedures  Critical Care performed: No  ____________________________________________   INITIAL IMPRESSION / ASSESSMENT AND PLAN / ED COURSE  Pertinent labs & imaging results that were available during my care of the patient were reviewed by me and considered in my medical decision making (see chart for details).   In left upper quadrant abdominal pain.  Patient at triage reported his chest pain, clinical history and symptoms she reports here are more consistent with a left upper quadrant pain crampy sometimes sharp in nature.  No signs or symptoms of obstruction.  The pain is located left upper sometimes referred to the left upper back, discussed with the patient  risks benefits of CT scan with shared medical decision making feel that monitoring her symptoms and foregoing CT scan to be the most reasonable course of action.  Her lab work and evaluation very reassuring.  No signs acute abdomen.  We will give her her home dose of Norvasc, also trial Maalox, Pepcid, this could be peptic ulcer disease or esophagitis, potential gas cramps, less likely kidney stone, no signs or symptoms of pyelonephritis.  Clinically doubt that this could represent anything like an acute abdomen, ischemia, vasculitis, pancreatitis, acute infectious etiology such as appendicitis, or dissection.  EKG no changes.  Troponin well within normal limit. PERC negative for PE risk.    ----------------------------------------- 2:28 PM on 09/11/2018 -----------------------------------------  Patient reports pain and symptoms completely resolved.  Well-appearing.  Would consider potentially this could be crampy, question possible constipation or given response to medications gastritis, esophagitis etc.  Discussed with the patient, she will follow closely with her primary care doctor  Return precautions and treatment recommendations and follow-up discussed with the patient who is agreeable with the plan.   Amal Baze was evaluated in Emergency Department on 09/11/2018 for the symptoms described in the history of present illness. She was evaluated in the context of the global COVID-19 pandemic, which necessitated consideration that the patient might be at risk for infection with the SARS-CoV-2 virus that causes COVID-19. Institutional protocols and algorithms that pertain to the evaluation of patients at risk for COVID-19 are in a state of rapid change based on information released by regulatory bodies including the CDC and federal and state organizations. These policies and algorithms were followed during the patient's care in the ED.  No covid symptoms noted        ____________________________________________   FINAL CLINICAL IMPRESSION(S) / ED DIAGNOSES  Final diagnoses:  LUQ abdominal pain  Hypertension, unspecified type        Note:  This document was prepared using Dragon voice recognition software and may include unintentional dictation errors       Delman Kitten, MD 09/11/18 1429

## 2018-09-11 NOTE — ED Triage Notes (Signed)
Pt arrived via POV with reports of chest pain off and on since May, pt also reports LUQ pain, back pain and migraine headache.

## 2018-09-11 NOTE — ED Notes (Signed)
Pt st she monitors her HR and while resting in bed at night its been around 118bpm. She st that her PCP increased the dose of her Amlodipine and Hctz in June to "decrease her HR". Pt st when her HR gets high she feels a tight pressure in her chest and becomes SOB. Pt st she takes her inhaler as needed for those episodes. No known hx of pulmonary problems. Pt st that she didn't get checked sooner b/c she thought it was anxiety. Sts that the pain radiates around her left side to her back.

## 2018-09-11 NOTE — Discharge Instructions (Addendum)
You were seen in the emergency room for abdominal pain. It is important that you follow up closely with your primary care doctor in the next couple of days. ° ° °Please return to the emergency room right away if you are to develop a fever, severe nausea, your pain becomes severe or worsens, you are unable to keep food down, begin vomiting any dark or bloody fluid, you develop any dark or bloody stools, feel dehydrated, or other new concerns or symptoms arise. ° ° °

## 2018-09-11 NOTE — ED Notes (Signed)
Pt asked to provide urine sample when able

## 2018-09-12 ENCOUNTER — Encounter: Payer: Self-pay | Admitting: Emergency Medicine

## 2018-11-27 ENCOUNTER — Emergency Department: Payer: Medicaid Other

## 2018-11-27 ENCOUNTER — Emergency Department
Admission: EM | Admit: 2018-11-27 | Discharge: 2018-11-27 | Disposition: A | Discharge status: Home / Self Care | Payer: Medicaid Other | Attending: Emergency Medicine | Admitting: Emergency Medicine

## 2018-11-27 ENCOUNTER — Other Ambulatory Visit: Payer: Self-pay

## 2018-11-27 DIAGNOSIS — I10 Essential (primary) hypertension: Secondary | ICD-10-CM | POA: Diagnosis not present

## 2018-11-27 DIAGNOSIS — R220 Localized swelling, mass and lump, head: Secondary | ICD-10-CM | POA: Diagnosis present

## 2018-11-27 DIAGNOSIS — T7840XA Allergy, unspecified, initial encounter: Secondary | ICD-10-CM | POA: Insufficient documentation

## 2018-11-27 DIAGNOSIS — E119 Type 2 diabetes mellitus without complications: Secondary | ICD-10-CM | POA: Insufficient documentation

## 2018-11-27 DIAGNOSIS — L299 Pruritus, unspecified: Secondary | ICD-10-CM | POA: Diagnosis not present

## 2018-11-27 DIAGNOSIS — Z79899 Other long term (current) drug therapy: Secondary | ICD-10-CM | POA: Diagnosis not present

## 2018-11-27 LAB — BASIC METABOLIC PANEL
Anion gap: 11 (ref 5–15)
BUN: 13 mg/dL (ref 6–20)
CO2: 26 mmol/L (ref 22–32)
Calcium: 9.2 mg/dL (ref 8.9–10.3)
Chloride: 100 mmol/L (ref 98–111)
Creatinine, Ser: 0.62 mg/dL (ref 0.44–1.00)
GFR calc Af Amer: >60 mL/min (ref >60)
GFR calc non Af Amer: >60 mL/min (ref >60)
Glucose, Bld: 163 mg/dL — ABNORMAL HIGH (ref 70–99)
Potassium: 3.2 mmol/L — ABNORMAL LOW (ref 3.5–5.1)
Sodium: 137 mmol/L (ref 135–145)

## 2018-11-27 LAB — CBC
HCT: 42 % (ref 36.0–46.0)
Hemoglobin: 14.3 g/dL (ref 12.0–15.0)
MCH: 26.8 pg (ref 26.0–34.0)
MCHC: 34 g/dL (ref 30.0–36.0)
MCV: 78.8 fL — ABNORMAL LOW (ref 80.0–100.0)
Platelets: 299 10*3/uL (ref 150–400)
RBC: 5.33 MIL/uL — ABNORMAL HIGH (ref 3.87–5.11)
RDW: 13.5 % (ref 11.5–15.5)
WBC: 10.1 10*3/uL (ref 4.0–10.5)
nRBC: 0 % (ref 0.0–0.2)

## 2018-11-27 LAB — POCT PREGNANCY, URINE: Preg Test, Ur: NEGATIVE

## 2018-11-27 LAB — TROPONIN I (HIGH SENSITIVITY): Troponin I (High Sensitivity): 3 ng/L (ref <18)

## 2018-11-27 MED ORDER — PREDNISONE 20 MG PO TABS
60.0000 mg | ORAL_TABLET | Freq: Once | ORAL | Status: AC
  Administered 2018-11-27: 60 mg via ORAL
  Filled 2018-11-27: qty 3

## 2018-11-27 MED ORDER — PREDNISONE 50 MG PO TABS: 50.0000 mg | ORAL_TABLET | Freq: Every day | ORAL | 0 refills | Status: DC

## 2018-11-27 NOTE — ED Triage Notes (Signed)
Pt arrived via POV with reports of allergic reaction to possibly to magnesium supplement she recently started taking. Pt states about 2 hours ago she noted swelling to her lips, pt states she also noticed bumps and itching last night.  Pt states she also feels like her throat is itching. No tongue swelling noted at this time. Pt states she took 2 Benadryl about 1 hour ago.   Pt also c/o chest pain that started about 30 mins PTA. Pt reports pain to the left side as well as right side at times and epigastric region. Pt describes the pain as sharp, pressure and rates the pain 7/10.

## 2018-11-27 NOTE — ED Notes (Signed)
D/w Dr. Cherylann Banas, new orders received for PO prednisone.

## 2018-11-27 NOTE — ED Notes (Signed)
Urine sent to lab with save label 

## 2018-11-27 NOTE — ED Provider Notes (Signed)
[facility] Emergency Department Provider Note   ____________________________________________    I have reviewed the triage vital signs and the nursing notes.   HISTORY  Chief Complaint Lip swelling   HPI Joan Alexander is a 33 y.o. female with history of diabetes, hypertension who presents because of swelling to her left upper lip as well as a rash and itching to her arms and legs.  Patient reports she took her medications at about 3:00 today including an antibiotic which she has been taking for sinusitis although she cannot remember the name of it.  The rest of her medication she has been taking for some time.  About an hour later she developed swelling to her left upper lip.  She denied intraoral swelling, no throat swelling.  No difficulty breathing.  Did have a brief discomfort in her chest, which lasted only a couple of minutes.  Took a Benadryl with some improvement.  Upon my seeing her the patient reports that symptoms have greatly improved  Past Medical History:  Diagnosis Date  . Chronic cholecystitis   . Diabetes mellitus without complication ([place])   . Gallstones   . Hypertension     Patient Active Problem List   Diagnosis Date Noted  . Atypical chest pain 07/30/2017  . SOB (shortness of breath) 07/30/2017    Past Surgical History:  Procedure Laterality Date  . CHOLECYSTECTOMY N/A 08/09/2017   Procedure: LAPAROSCOPIC CHOLECYSTECTOMY;  Surgeon: Vickie Epley, MD;  Location: [facility] ORS;  Service: General;  Laterality: N/A;  . TONSILLECTOMY AND ADENOIDECTOMY Bilateral 02/11/2017   Procedure: TONSILLECTOMY;  Surgeon: Carloyn Manner, MD;  Location: [facility] ORS;  Service: ENT;  Laterality: Bilateral;    Prior to Admission medications   Medication Sig Start Date End Date Taking? Authorizing Provider  amLODipine (NORVASC) 10 MG tablet Take 10 mg by mouth every evening.     [provider]  hydrochlorothiazide (HYDRODIURIL) 25 MG  tablet Take 25 mg by mouth every evening.  01/26/17   [provider]  predniSONE (DELTASONE) 50 MG tablet Take 1 tablet (50 mg total) by mouth daily with breakfast. 11/27/18   Lavonia Drafts, MD  PROAIR HFA 108 [id number] Base) MCG/ACT inhaler Inhale 2 puffs into the lungs every 4 (four) hours as needed for wheezing.  05/12/17   [provider]     Allergies Lisinopril  No family history on file.  Social History Social History   Tobacco Use  . Smoking status: Never Smoker  . Smokeless tobacco: Never Used  Substance Use Topics  . Alcohol use: No  . Drug use: No    Review of Systems  Constitutional: No fever/chills Eyes: No visual changes.  ENT: No intraoral swelling, as above Cardiovascular: Denies chest pain. Respiratory: Denies shortness of breath. Gastrointestinal: No abdominal pain.  No nausea, no vomiting.   Genitourinary: Negative for dysuria. Musculoskeletal: Negative for joint swelling Skin: Negative for rash. Neurological: Negative for headaches    ____________________________________________   PHYSICAL EXAM:  VITAL SIGNS: ED Triage Vitals [11/27/18 1813]  Enc Vitals Group     BP (!) 159/107     Pulse Rate (!) 102     Resp 18     Temp 98.2 F (36.8 C)     Temp Source Oral     SpO2 98 %     Weight 99.8 kg (220 lb)     Height 1.626 m (5\' 4" )     Head Circumference      Peak  Flow      Pain Score 7     Pain Loc      Pain Edu?      Excl. in GC?     Constitutional: Alert and oriented.  Eyes: Conjunctivae are normal.  Head: Atraumatic. Nose: No congestion/rhinnorhea. Mouth/Throat: Mucous membranes are moist.  Left upper lip mildly swollen, pharynx normal, uvula normal Neck:  Painless ROM Cardiovascular: Normal rate, regular rhythm.   Good peripheral circulation. Respiratory: Normal respiratory effort.  No retractions Gastrointestinal: Soft and nontender. No distention.  No CVA tenderness.  Musculoskeletal:  Warm and well perfused  Neurologic:  Normal speech and language. No gross focal neurologic deficits are appreciated.  Skin:  Skin is warm, dry and intact.  No urticarial rash Psychiatric: Mood and affect are normal. Speech and behavior are normal.  ____________________________________________   LABS (all labs ordered are listed, but only abnormal results are displayed)  Labs Reviewed  BASIC METABOLIC PANEL - Abnormal; Notable for the following components:      Result Value   Potassium 3.2 (*)    Glucose, Bld 163 (*)    All other components within normal limits  CBC - Abnormal; Notable for the following components:   RBC 5.33 (*)    MCV 78.8 (*)    All other components within normal limits  POCT PREGNANCY, URINE  POC URINE PREG, ED  TROPONIN I (HIGH SENSITIVITY)   ____________________________________________  EKG  ED ECG REPORT I, Jene Every, the attending physician, personally viewed and interpreted this ECG.  Date: 11/27/2018  Rhythm: normal sinus rhythm QRS Axis: normal Intervals: normal ST/T Wave abnormalities: normal Narrative Interpretation: no evidence of acute ischemia  ____________________________________________  RADIOLOGY  None ____________________________________________   PROCEDURES  Procedure(s) performed: No  Procedures   Critical Care performed: No ____________________________________________   INITIAL IMPRESSION / ASSESSMENT AND PLAN / ED COURSE  Pertinent labs & imaging results that were available during my care of the patient were reviewed by me and considered in my medical decision making (see chart for details).  Patient presents with swelling of the lip as well as the development of rash with itching to her arms and legs suspicious for allergic reaction.  Received prednisone in triage which seems to have helped her significantly.  Lip swelling has decreased.  Rash has resolved.  No intraoral swelling.  It is been greater than 4 hours since onset and  patient is anxious to leave.  We will Rx prednisone, recommend Benadryl as needed.  Strict return precautions    ____________________________________________   FINAL CLINICAL IMPRESSION(S) / ED DIAGNOSES  Final diagnoses:  Allergic reaction to drug, initial encounter        Note:  This document was prepared using Dragon voice recognition software and may include unintentional dictation errors.   Jene Every, MD 11/27/18 2235

## 2018-11-27 NOTE — ED Notes (Signed)
Pt awake and alert in lobby; waiting patiently for treatment room

## 2019-03-07 ENCOUNTER — Encounter: Payer: Self-pay | Admitting: Emergency Medicine

## 2019-03-07 ENCOUNTER — Emergency Department
Admission: EM | Admit: 2019-03-07 | Discharge: 2019-03-07 | Disposition: A | Discharge status: Home / Self Care | Payer: Medicaid Other | Attending: Emergency Medicine | Admitting: Emergency Medicine

## 2019-03-07 ENCOUNTER — Emergency Department: Payer: Medicaid Other

## 2019-03-07 ENCOUNTER — Other Ambulatory Visit: Payer: Self-pay

## 2019-03-07 DIAGNOSIS — Z9049 Acquired absence of other specified parts of digestive tract: Secondary | ICD-10-CM | POA: Insufficient documentation

## 2019-03-07 DIAGNOSIS — E119 Type 2 diabetes mellitus without complications: Secondary | ICD-10-CM | POA: Diagnosis not present

## 2019-03-07 DIAGNOSIS — R079 Chest pain, unspecified: Secondary | ICD-10-CM | POA: Diagnosis present

## 2019-03-07 DIAGNOSIS — I10 Essential (primary) hypertension: Secondary | ICD-10-CM | POA: Diagnosis not present

## 2019-03-07 DIAGNOSIS — R0789 Other chest pain: Secondary | ICD-10-CM

## 2019-03-07 DIAGNOSIS — R519 Headache, unspecified: Secondary | ICD-10-CM | POA: Diagnosis not present

## 2019-03-07 LAB — BASIC METABOLIC PANEL
Anion gap: 12 (ref 5–15)
BUN: 9 mg/dL (ref 6–20)
CO2: 27 mmol/L (ref 22–32)
Calcium: 9 mg/dL (ref 8.9–10.3)
Chloride: 97 mmol/L — ABNORMAL LOW (ref 98–111)
Creatinine, Ser: 0.47 mg/dL (ref 0.44–1.00)
GFR calc Af Amer: >60 mL/min (ref >60)
GFR calc non Af Amer: >60 mL/min (ref >60)
Glucose, Bld: 111 mg/dL — ABNORMAL HIGH (ref 70–99)
Potassium: 3.2 mmol/L — ABNORMAL LOW (ref 3.5–5.1)
Sodium: 136 mmol/L (ref 135–145)

## 2019-03-07 LAB — CBC
HCT: 45 % (ref 36.0–46.0)
Hemoglobin: 14.9 g/dL (ref 12.0–15.0)
MCH: 27.1 pg (ref 26.0–34.0)
MCHC: 33.1 g/dL (ref 30.0–36.0)
MCV: 81.8 fL (ref 80.0–100.0)
Platelets: 274 10*3/uL (ref 150–400)
RBC: 5.5 MIL/uL — ABNORMAL HIGH (ref 3.87–5.11)
RDW: 13.6 % (ref 11.5–15.5)
WBC: 6.7 10*3/uL (ref 4.0–10.5)
nRBC: 0 % (ref 0.0–0.2)

## 2019-03-07 LAB — URINALYSIS, COMPLETE (UACMP) WITH MICROSCOPIC
Bilirubin Urine: NEGATIVE
Glucose, UA: NEGATIVE mg/dL
Ketones, ur: NEGATIVE mg/dL
Leukocytes,Ua: NEGATIVE
Nitrite: NEGATIVE
Protein, ur: NEGATIVE mg/dL
Specific Gravity, Urine: 1.005 (ref 1.005–1.030)
pH: 8 (ref 5.0–8.0)

## 2019-03-07 LAB — TROPONIN I (HIGH SENSITIVITY): Troponin I (High Sensitivity): 5 ng/L (ref <18)

## 2019-03-07 MED ORDER — SODIUM CHLORIDE 0.9% FLUSH: 3.0000 mL | Freq: Once | INTRAVENOUS | Status: DC

## 2019-03-07 MED ORDER — KETOROLAC TROMETHAMINE 30 MG/ML IJ SOLN
30.0000 mg | Freq: Once | INTRAMUSCULAR | Status: AC
  Administered 2019-03-07: 10:00:00 30 mg via INTRAVENOUS
  Filled 2019-03-07: qty 1

## 2019-03-07 MED ORDER — METOCLOPRAMIDE HCL 5 MG/ML IJ SOLN
10.0000 mg | Freq: Once | INTRAMUSCULAR | Status: AC
  Administered 2019-03-07: 10:00:00 10 mg via INTRAVENOUS
  Filled 2019-03-07: qty 2

## 2019-03-07 MED ORDER — SODIUM CHLORIDE 0.9 % IV SOLN: Freq: Once | INTRAVENOUS | Status: AC

## 2019-03-07 NOTE — ED Triage Notes (Signed)
Presents with some intermittent chest pain and headache for couple of days  Also she noticed that her b/p was elevated

## 2019-03-07 NOTE — ED Notes (Signed)
Pt st "I jus feel weird and hot" after administration of medications (see MAR). Pt st "I want to use the bathroom". Pt able to ambulate to bathroom with a steady gait. A/Ox4. Pt denies SHOB/CP at this time. NAD.

## 2019-03-07 NOTE — ED Provider Notes (Signed)
[facility] Emergency Department Provider Note       Time seen: ----------------------------------------- 9:02 AM on 03/07/2019 -----------------------------------------   I have reviewed the triage vital signs and the nursing notes.  HISTORY   Chief Complaint Hypertension and Chest Pain    HPI Joan Alexander is a 34 y.o. female with a history of diabetes, gallstones, hypertension who presents to the ED for intermittent chest pain and headache for several days.  Patient states she also noted her blood pressure was elevated.  She denies fevers, chills, cough, vomiting or diarrhea.  Nothing makes her symptoms better or worse.  Past Medical History:  Diagnosis Date  . Chronic cholecystitis   . Diabetes mellitus without complication (HCC)   . Gallstones   . Hypertension     Patient Active Problem List   Diagnosis Date Noted  . Atypical chest pain 07/30/2017  . SOB (shortness of breath) 07/30/2017    Past Surgical History:  Procedure Laterality Date  . CHOLECYSTECTOMY N/A 08/09/2017   Procedure: LAPAROSCOPIC CHOLECYSTECTOMY;  Surgeon: Ancil Linsey, MD;  Location: [facility] ORS;  Service: General;  Laterality: N/A;  . TONSILLECTOMY AND ADENOIDECTOMY Bilateral 02/11/2017   Procedure: TONSILLECTOMY;  Surgeon: Bud Face, MD;  Location: [facility] ORS;  Service: ENT;  Laterality: Bilateral;    Allergies Lisinopril  Social History Social History   Tobacco Use  . Smoking status: Never Smoker  . Smokeless tobacco: Never Used  Substance Use Topics  . Alcohol use: No  . Drug use: No    Review of Systems Constitutional: Negative for fever. Cardiovascular: Positive for chest pain Respiratory: Negative for shortness of breath. Gastrointestinal: Negative for abdominal pain, vomiting and diarrhea. Musculoskeletal: Negative for back pain. Skin: Negative for rash. Neurological: Positive for headache  All systems negative/normal/unremarkable except as  stated in the HPI  ____________________________________________   PHYSICAL EXAM:  VITAL SIGNS: ED Triage Vitals [03/07/19 0834]  Enc Vitals Group     BP (!) 161/109     Pulse Rate 88     Resp 20     Temp 98.4 F (36.9 C)     Temp Source Oral     SpO2 99 %     Weight 225 lb (102.1 kg)     Height 5\' 4"  (1.626 m)     Head Circumference      Peak Flow      Pain Score 8     Pain Loc      Pain Edu?      Excl. in GC?     Constitutional: Alert and oriented. Well appearing and in no distress. Eyes: Conjunctivae are normal. Normal extraocular movements. ENT      Head: Normocephalic and atraumatic.      Nose: No congestion/rhinnorhea.      Mouth/Throat: Mucous membranes are moist.      Neck: No stridor. Cardiovascular: Normal rate, regular rhythm. No murmurs, rubs, or gallops. Respiratory: Normal respiratory effort without tachypnea nor retractions. Breath sounds are clear and equal bilaterally. No wheezes/rales/rhonchi. Gastrointestinal: Soft and nontender. Normal bowel sounds Musculoskeletal: Nontender with normal range of motion in extremities. No lower extremity tenderness nor edema. Neurologic:  Normal speech and language. No gross focal neurologic deficits are appreciated.  Skin:  Skin is warm, dry and intact. No rash noted. Psychiatric: Mood and affect are normal. Speech and behavior are normal.  ____________________________________________  EKG: Interpreted by me.  Sinus rhythm with rate of 79 bpm, left axis deviation, LVH, normal QT  ____________________________________________  ED COURSE:  As part of my medical decision making, I reviewed the following data within the [place] History obtained from family if available, nursing notes, old chart and ekg, as well as notes from prior ED visits. Patient presented for chest pain, we will assess with labs and imaging as indicated at this time.   Procedures  Joan Alexander was evaluated in Emergency  Department on 03/07/2019 for the symptoms described in the history of present illness. She was evaluated in the context of the global COVID-19 pandemic, which necessitated consideration that the patient might be at risk for infection with the SARS-CoV-2 virus that causes COVID-19. Institutional protocols and algorithms that pertain to the evaluation of patients at risk for COVID-19 are in a state of rapid change based on information released by regulatory bodies including the CDC and federal and state organizations. These policies and algorithms were followed during the patient's care in the ED.  ____________________________________________   LABS (pertinent positives/negatives)  Labs Reviewed  BASIC METABOLIC PANEL - Abnormal; Notable for the following components:      Result Value   Potassium 3.2 (*)    Chloride 97 (*)    Glucose, Bld 111 (*)    All other components within normal limits  CBC - Abnormal; Notable for the following components:   RBC 5.50 (*)    All other components within normal limits  URINALYSIS, COMPLETE (UACMP) WITH MICROSCOPIC - Abnormal; Notable for the following components:   Color, Urine STRAW (*)    APPearance CLEAR (*)    Hgb urine dipstick LARGE (*)    Bacteria, UA RARE (*)    All other components within normal limits  POC URINE PREG, ED  POC URINE PREG, ED  TROPONIN I (HIGH SENSITIVITY)  TROPONIN I (HIGH SENSITIVITY)    RADIOLOGY  Chest x-ray IMPRESSION: Lungs clear.  No evident adenopathy. ____________________________________________   DIFFERENTIAL DIAGNOSIS   Musculoskeletal pain, GERD, migraine, tension headache, dehydration, electrolyte abnormality  FINAL ASSESSMENT AND PLAN  Headache, chest pain   Plan: The patient had presented for chest pain and headache. Patient's labs are unremarkable. Patient's imaging did not reveal any acute process.  Patient states her symptoms are gone, she wants to leave and go home and rest.  No clear etiology for  her symptoms.  She is advised to follow-up on Thursday with her doctor as scheduled.   Laurence Aly, MD    Note: This note was generated in part or whole with voice recognition software. Voice recognition is usually quite accurate but there are transcription errors that can and very often do occur. I apologize for any typographical errors that were not detected and corrected.     Earleen Newport, MD 03/07/19 1102

## 2019-03-07 NOTE — ED Notes (Signed)
Pt denies CP/SHOB/Nuausea at this time. NAD noted at this time.

## 2019-03-07 NOTE — ED Notes (Signed)
Pt taking to X-ray at this time.

## 2019-03-07 NOTE — ED Notes (Addendum)
Pt st running out her BP meds, to include : 10mg  amlodipine and 25 mg hydrochlorothiazide. Pt st getting Rx yesterday (took last dose last night).

## 2019-03-23 ENCOUNTER — Ambulatory Visit: Payer: Medicaid Other | Attending: Internal Medicine

## 2019-05-01 IMAGING — CR DG CHEST 2V
1 series · 2 of 2 positions shown · non-contrast
Comparison: 08/29/2015

CLINICAL DATA: Nausea and vomiting since 4 a.m. this morning. Chest
pain for 1 week.

EXAM:
CHEST - 2 VIEW

[Series 1: dg chest 2 view · 0.14mm/px · 2 of 2 slices shown]
[im 1/2]
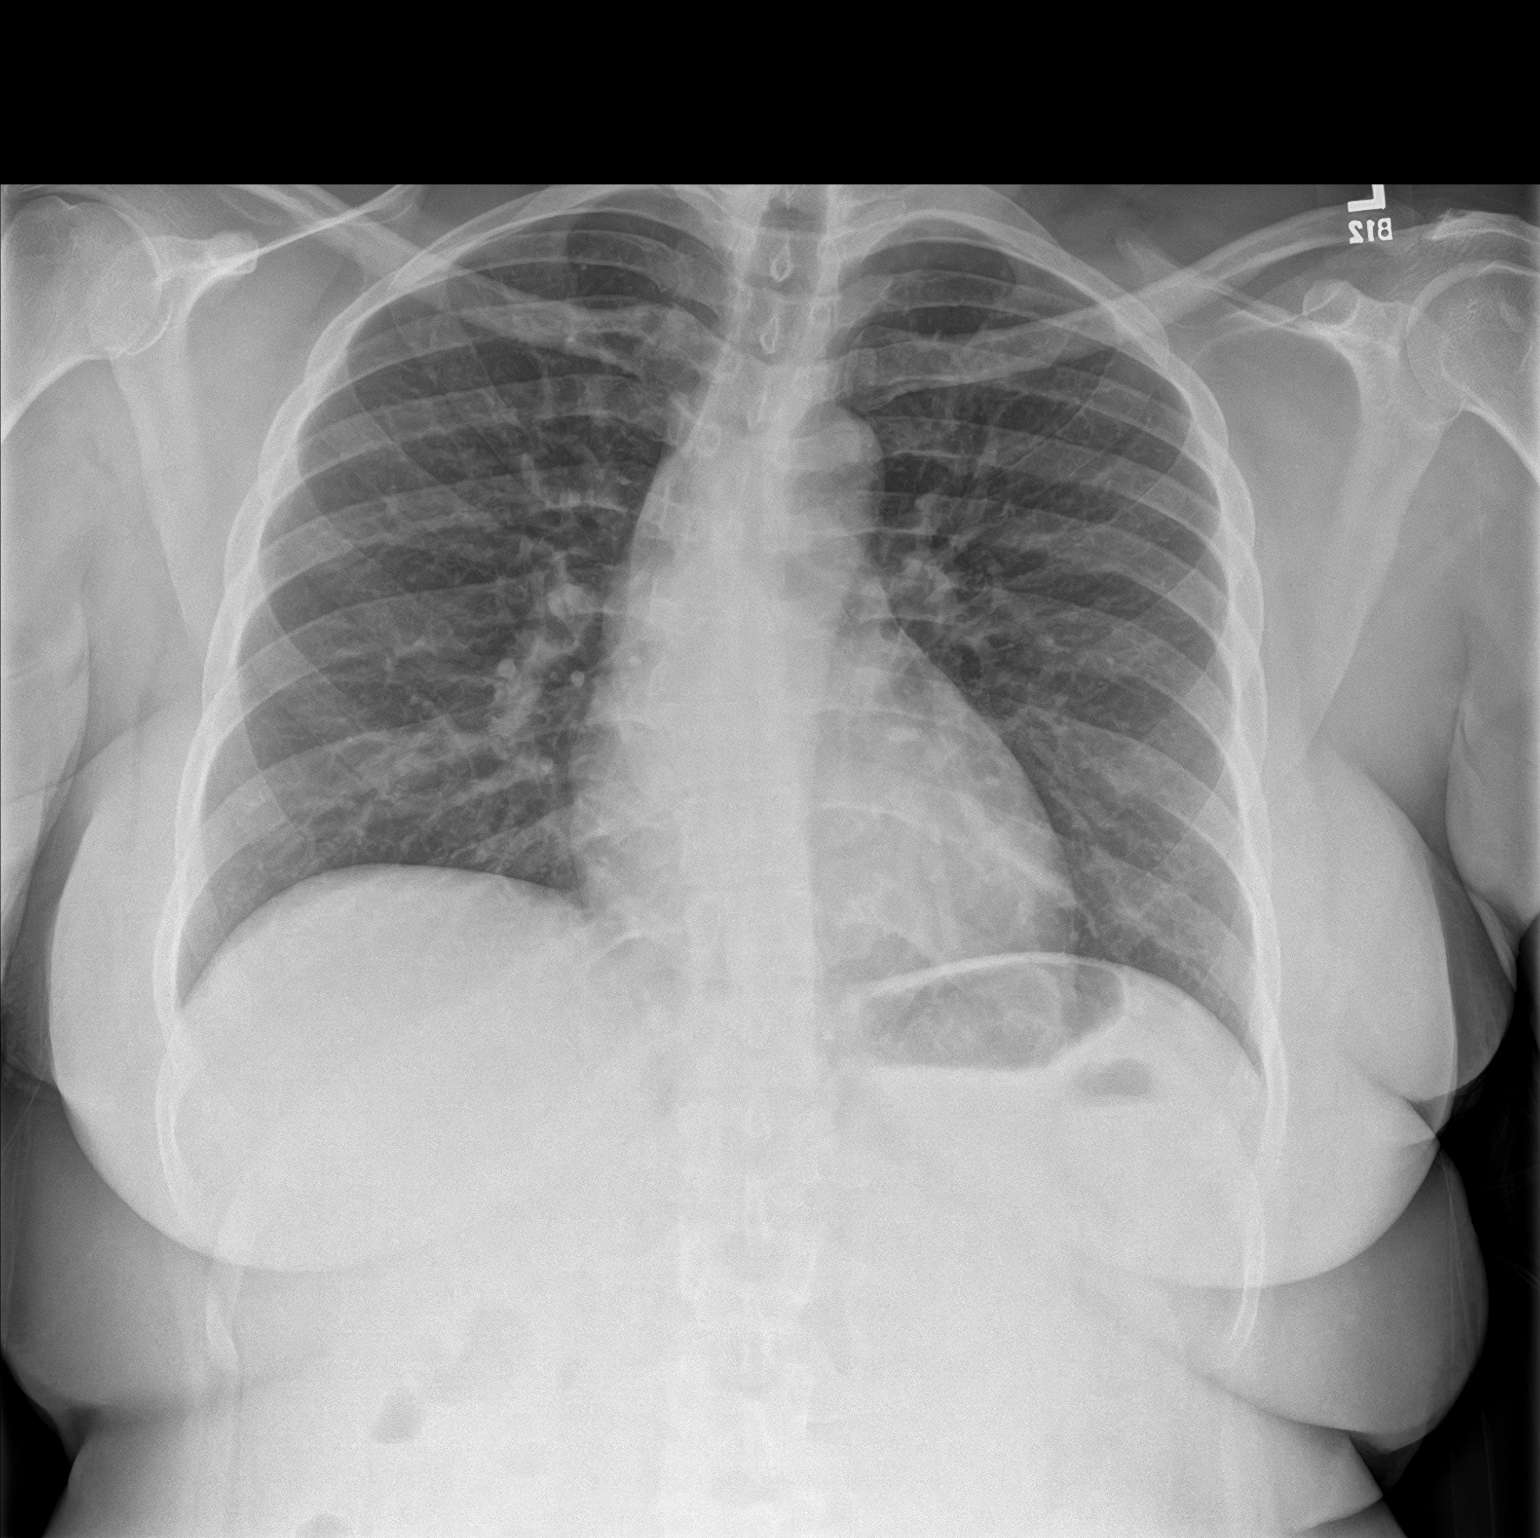
[im 2/2]
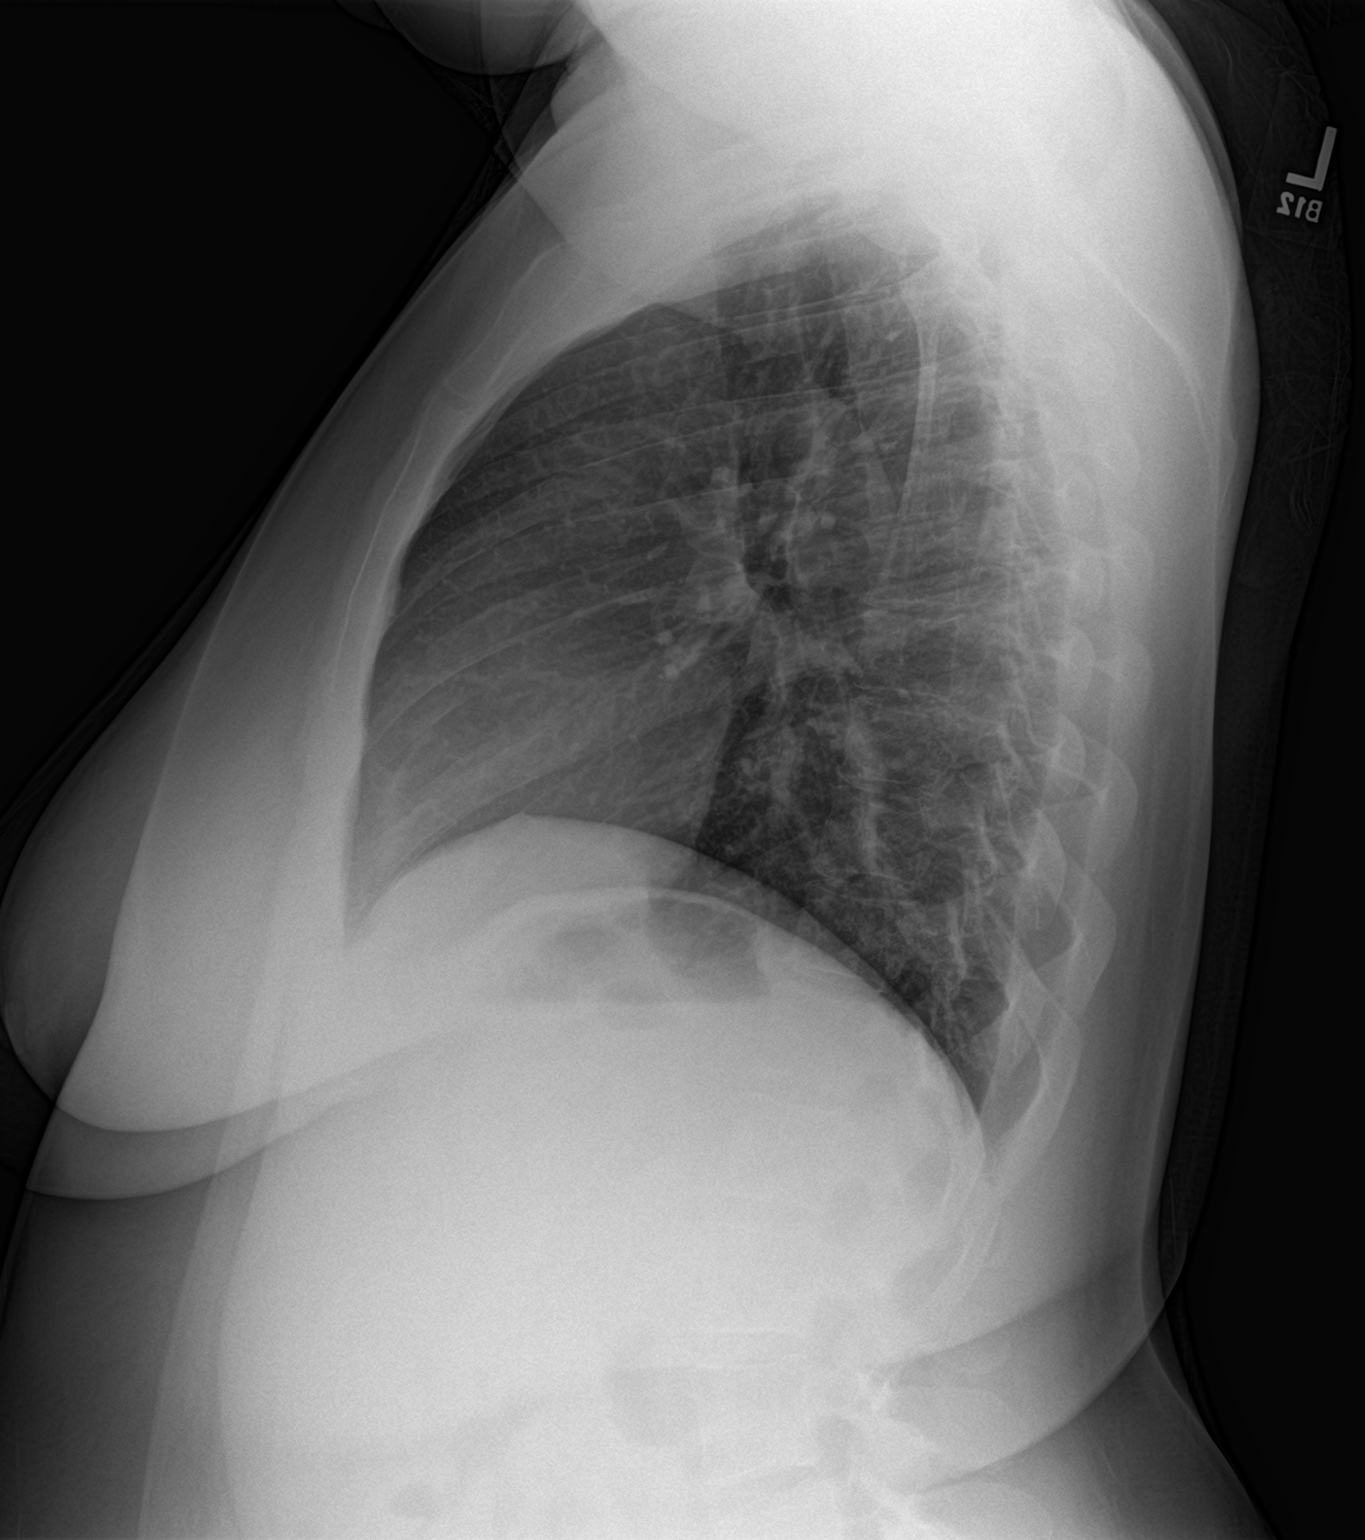

[2 of 2 positions shown; findings below may reference images not displayed]

FINDINGS: The cardiac silhouette, mediastinal and hilar contours are within
normal limits and stable. The lungs are clear. No pleural effusion.
No worrisome pulmonary lesions. The bony thorax is intact.
IMPRESSION: Normal chest x-ray.

## 2019-08-02 IMAGING — CR DG CHEST 2V
1 series · 2 of 2 positions shown · non-contrast
Comparison: 03/29/2017

CLINICAL DATA: Shortness of breath, abdominal pain, and nausea.
Right upper quadrant pain radiating to the right shoulder.

EXAM:
CHEST - 2 VIEW

[Series 1: dg chest 2 view · 0.14mm/px · 2 of 2 slices shown]
[im 1/2]
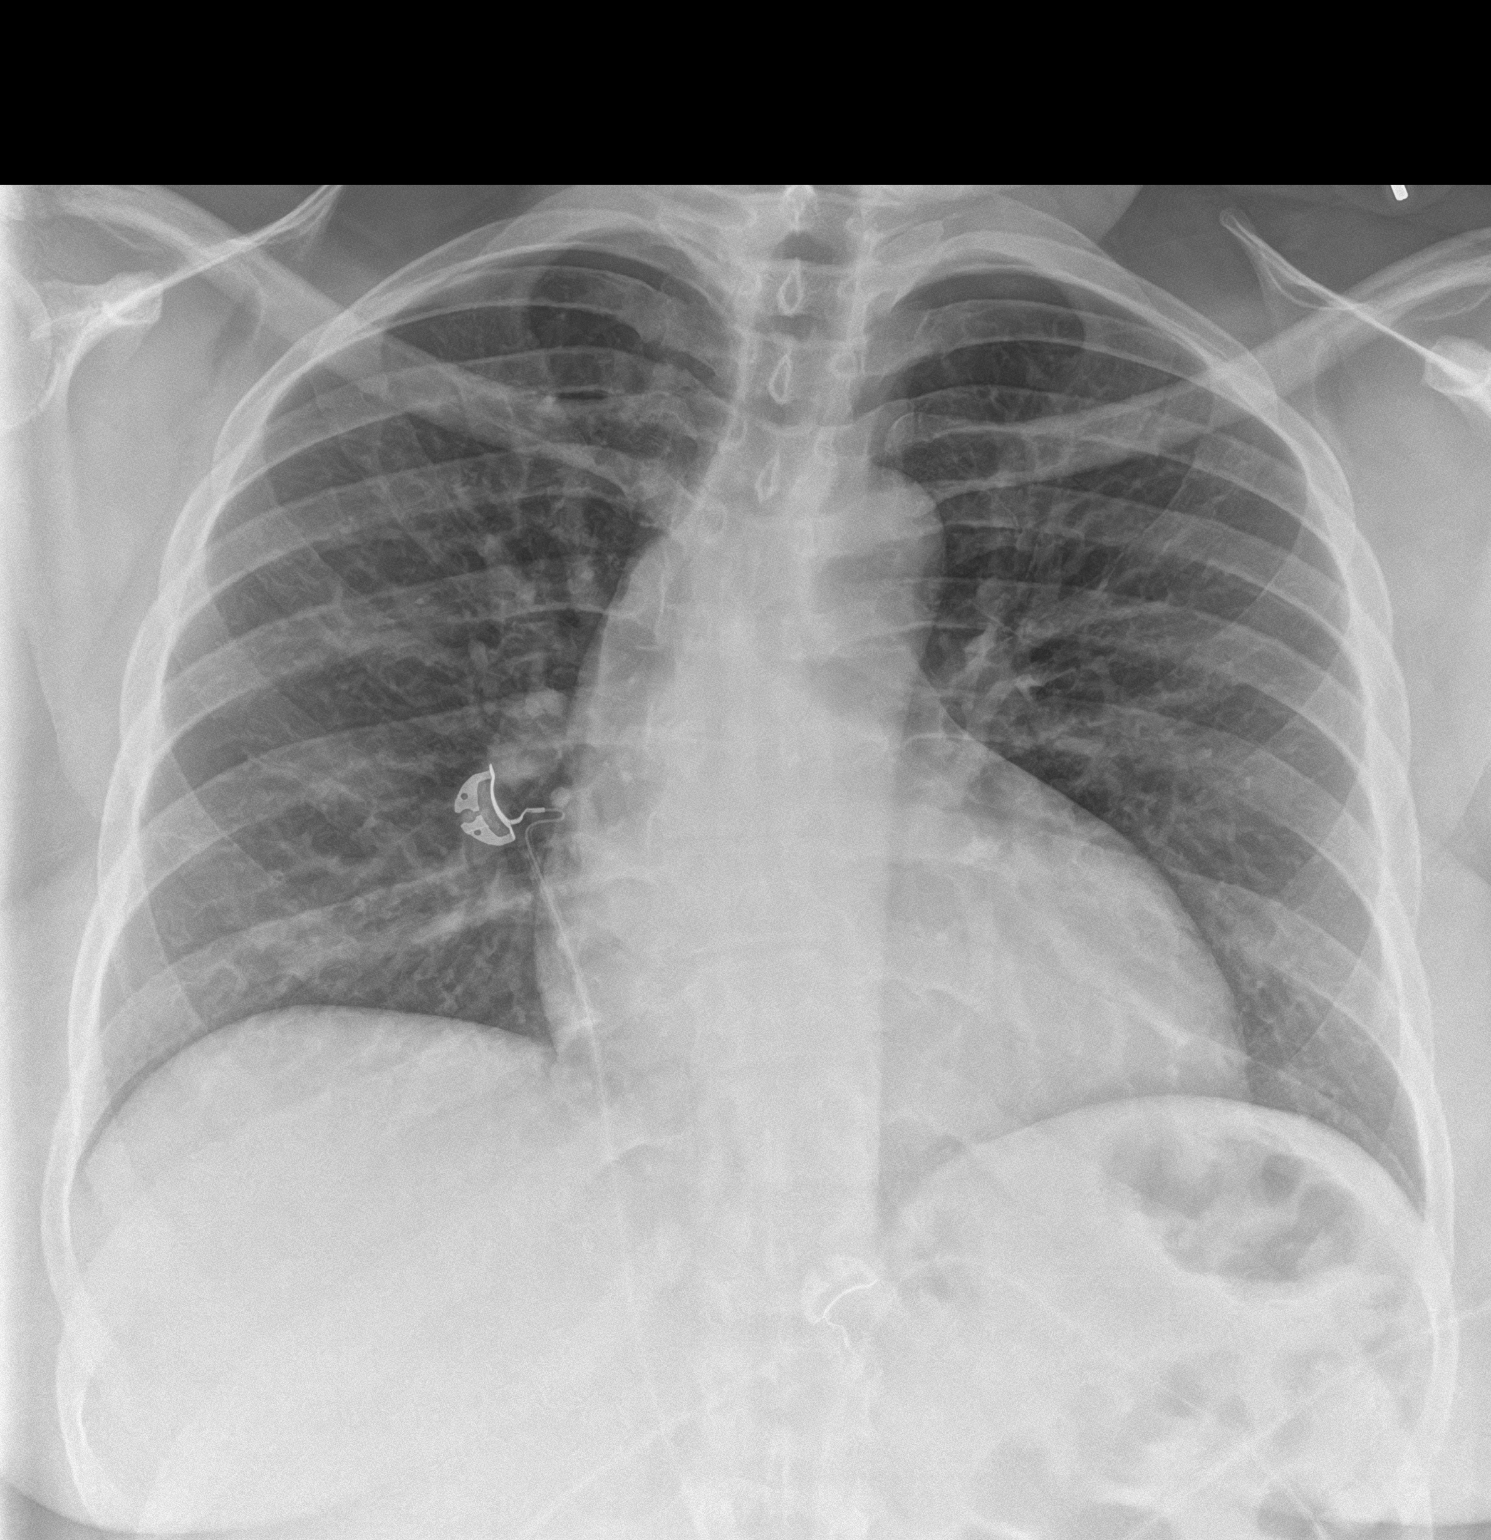
[im 2/2]
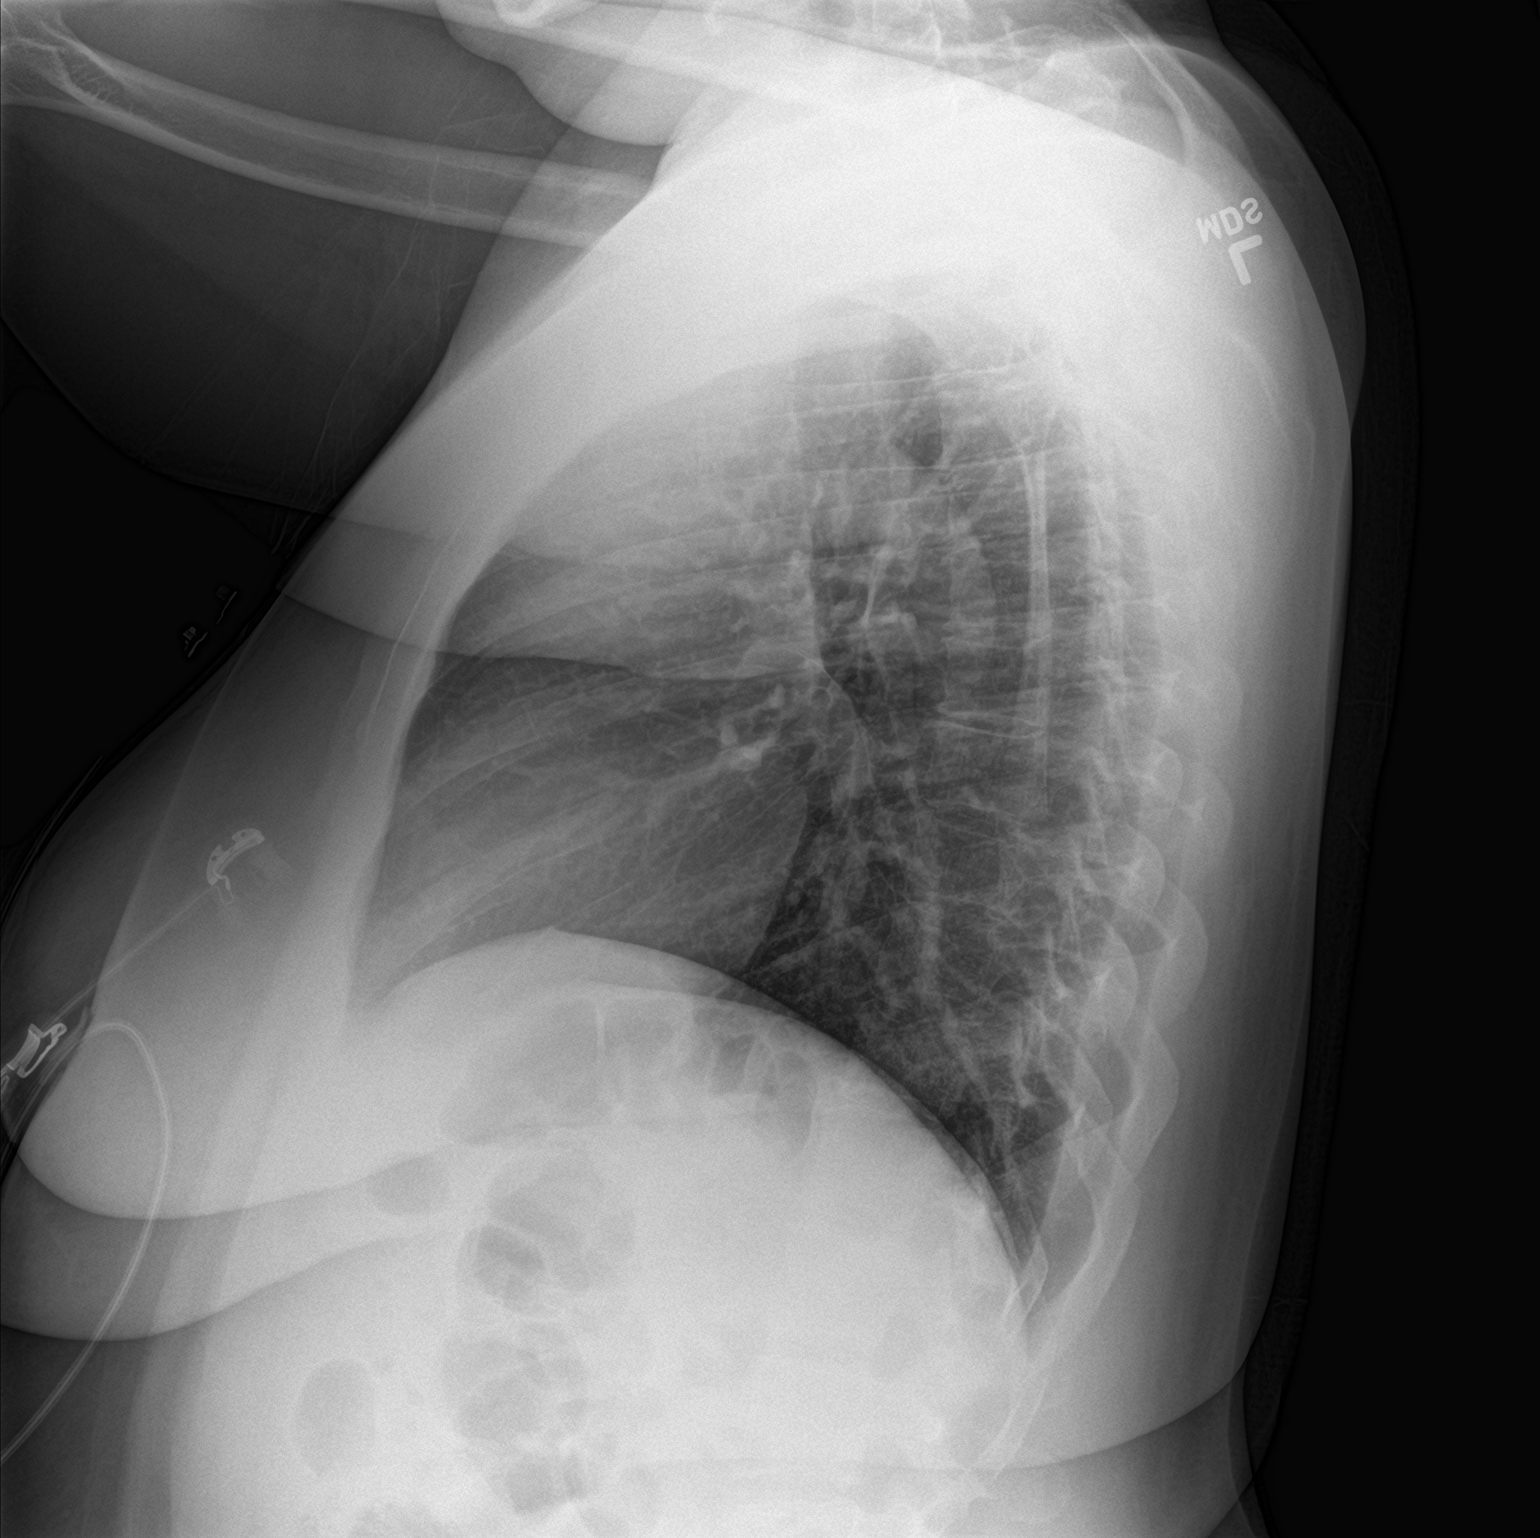

[2 of 2 positions shown; findings below may reference images not displayed]

FINDINGS: New borderline cardiomegaly. Pulmonary vascularity is normal. Lungs
are clear. No effusions. No bone abnormality.
IMPRESSION: New borderline cardiomegaly.  Otherwise normal exam.

## 2019-09-19 ENCOUNTER — Other Ambulatory Visit: Payer: Self-pay

## 2019-09-29 ENCOUNTER — Ambulatory Visit (LOCAL_COMMUNITY_HEALTH_CENTER): Payer: Self-pay

## 2019-09-29 ENCOUNTER — Other Ambulatory Visit: Payer: Self-pay

## 2019-09-29 DIAGNOSIS — Z111 Encounter for screening for respiratory tuberculosis: Secondary | ICD-10-CM

## 2019-10-02 ENCOUNTER — Other Ambulatory Visit: Payer: Self-pay

## 2019-10-02 ENCOUNTER — Ambulatory Visit (LOCAL_COMMUNITY_HEALTH_CENTER): Payer: Medicaid Other

## 2019-10-02 VITALS — Ht 64.0 in | Wt 233.0 lb

## 2019-10-02 DIAGNOSIS — R7611 Nonspecific reaction to tuberculin skin test without active tuberculosis: Secondary | ICD-10-CM | POA: Diagnosis not present

## 2019-10-02 NOTE — Progress Notes (Signed)
Per client, does not have asthma. Takes Montelukast and ProAir inhaler due to allergy to [facility]. States "I'm allergic to [facility]!"  Counseled regarding significance of positive TST, need for CXR and recommendation for TLTBI (provided CXR without evidence of active TB). Client asyptomatic for TB. She does report poor appetite. Weight = 233 #. PCP is [facility] and ROI (to [place]) signed and sent for scanning. As client states interested in TLTBI, baseline labs (CBC, diff, platelets, hepatic functional panel, HIV, RPR) drawn. CXR ordered in Epic and referral provided to client. Client also provided Latent versus Active TB info sheet. Client's questions answered. Jossie Ng, RN

## 2019-10-03 ENCOUNTER — Ambulatory Visit
Admission: RE | Admit: 2019-10-03 | Discharge: 2019-10-03 | Disposition: A | Discharge status: Home / Self Care | Payer: Medicaid Other | Attending: Family Medicine | Admitting: Family Medicine

## 2019-10-03 ENCOUNTER — Ambulatory Visit
Admission: RE | Admit: 2019-10-03 | Discharge: 2019-10-03 | Disposition: A | Discharge status: Home / Self Care | Payer: Medicaid Other | Source: Ambulatory Visit | Attending: Family Medicine | Admitting: Family Medicine

## 2019-10-03 DIAGNOSIS — R7611 Nonspecific reaction to tuberculin skin test without active tuberculosis: Secondary | ICD-10-CM | POA: Diagnosis not present

## 2019-10-03 LAB — CBC WITH DIFFERENTIAL/PLATELET
Basophils Absolute: 0.1 10*3/uL (ref 0.0–0.2)
Basos: 1 %
EOS (ABSOLUTE): 0.3 10*3/uL (ref 0.0–0.4)
Eos: 6 %
Hematocrit: 42 % (ref 34.0–46.6)
Hemoglobin: 13.5 g/dL (ref 11.1–15.9)
Immature Grans (Abs): 0 10*3/uL (ref 0.0–0.1)
Immature Granulocytes: 0 %
Lymphocytes Absolute: 3.1 10*3/uL (ref 0.7–3.1)
Lymphs: 50 %
MCH: 26.6 pg (ref 26.6–33.0)
MCHC: 32.1 g/dL (ref 31.5–35.7)
MCV: 83 fL (ref 79–97)
Monocytes Absolute: 0.5 10*3/uL (ref 0.1–0.9)
Monocytes: 9 %
Neutrophils Absolute: 2 10*3/uL (ref 1.4–7.0)
Neutrophils: 34 %
Platelets: 270 10*3/uL (ref 150–450)
RBC: 5.07 x10E6/uL (ref 3.77–5.28)
RDW: 13.1 % (ref 11.7–15.4)
WBC: 6 10*3/uL (ref 3.4–10.8)

## 2019-10-03 LAB — HEPATIC FUNCTION PANEL
ALT: 39 IU/L — ABNORMAL HIGH (ref 0–32)
AST: 22 IU/L (ref 0–40)
Albumin: 4.4 g/dL (ref 3.8–4.8)
Alkaline Phosphatase: 68 IU/L (ref 44–121)
Bilirubin Total: 0.3 mg/dL (ref 0.0–1.2)
Bilirubin, Direct: <0.1 mg/dL (ref 0.00–0.40)
Total Protein: 7.5 g/dL (ref 6.0–8.5)

## 2019-10-04 ENCOUNTER — Other Ambulatory Visit: Payer: Self-pay

## 2019-10-04 ENCOUNTER — Telehealth: Payer: Self-pay

## 2019-10-04 DIAGNOSIS — Z227 Latent tuberculosis: Secondary | ICD-10-CM

## 2019-10-04 NOTE — Telephone Encounter (Signed)
TC with patient.  Discussed normal CXR. Patient is still interested in LTBI tx. Has IMM appt on 10/10/19. TB RN sent Dr. Sherryll Burger orders for Rifampin and scheduled patient's first appt same day as IMM appt.  TB RN has prepared forms for Rifampin appt.  Patient needs TBS form for school (included in outguide- ck accordian in nurse clinic). Richmond Campbell, RN

## 2019-10-04 NOTE — Progress Notes (Addendum)
Tuberculosis treatment orders  All patients are to be monitored per [place] and county TB policies.   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 Treat for latent TB per the following:  Rifampin 600mg  daily by mouth x 4 months, draw LFTs monthly.  CXR without Active TB on 10/03/19; +PPD  Order sent to Dr. 10/05/19 for signature Sherryll Burger, RN

## 2019-10-10 ENCOUNTER — Other Ambulatory Visit: Payer: Self-pay

## 2019-10-10 ENCOUNTER — Ambulatory Visit (LOCAL_COMMUNITY_HEALTH_CENTER): Payer: Medicaid Other

## 2019-10-10 VITALS — Wt 230.5 lb

## 2019-10-10 DIAGNOSIS — Z23 Encounter for immunization: Secondary | ICD-10-CM | POA: Diagnosis not present

## 2019-10-10 DIAGNOSIS — Z227 Latent tuberculosis: Secondary | ICD-10-CM

## 2019-10-10 MED ORDER — RIFAMPIN 300 MG PO CAPS: 600.0000 mg | ORAL_CAPSULE | Freq: Every day | ORAL | 0 refills | Status: DC

## 2019-10-10 NOTE — Progress Notes (Signed)
Tolerated vaccines: MMR, Varicella, Tdap well today. Updated [facility] copy and recommended schedule given. Also here for LTBI tx (Rifampin #1) Josie Saunders, RN

## 2019-10-10 NOTE — Progress Notes (Signed)
Here for LTBI treatment. To start Rifampin. TB Screen completed per pt request as she needs it for school. LTBI treatment agreement signed by pt. PCP provider letter completed and faxed on 10/11/2019 to [facility] (fax [phone]). Rifampin #1 bottle 300 mg #60 dispensed per Dr. Sherryll Burger order dated 10/04/2019. Instructions given  to take 2 capsules daily by mouth. Rifampin info sheet reviewed and TB Coordinator (M.Dorminy, RN) contact info card given. Advised to call with questions/concerns. No labs drawn today, but will be due at next visit. To clerk to schedule return appt. In one month. Jerel Shepherd, RN

## 2019-11-09 ENCOUNTER — Other Ambulatory Visit: Payer: Self-pay

## 2019-11-09 ENCOUNTER — Ambulatory Visit (LOCAL_COMMUNITY_HEALTH_CENTER): Payer: Medicaid Other

## 2019-11-09 VITALS — Wt 227.5 lb

## 2019-11-09 DIAGNOSIS — Z23 Encounter for immunization: Secondary | ICD-10-CM | POA: Diagnosis not present

## 2019-11-09 DIAGNOSIS — Z227 Latent tuberculosis: Secondary | ICD-10-CM

## 2019-11-09 MED ORDER — RIFAMPIN 300 MG PO CAPS: 600.0000 mg | ORAL_CAPSULE | Freq: Every day | ORAL | 0 refills | Status: AC

## 2019-11-09 NOTE — Progress Notes (Signed)
Presents for TLTBI medication evaluation and has 5 remaining doses in first bottle (brought to clinic). States Rifampin in combination with flu vaccine 2 weeks ago caused some N/V and didn't take for 2 days and has been fine since. Reports any vomiting was at least 2 hours after Rifampin taken. Reports changes in medications (see medicine list) and per phone verbal order wth Dr. Alvester Morin, continue Rifampin as per  Dr. Ellamae Sia Shah's written order on 10/04/2019. Bottle #2 for Rifampin dispensed today per orders. Client correctly verbalizes how to take medicines and interventions to take should potential side effects occur. Client to schedule return appt in 4 - 5 weeks. Jossie Ng, RN

## 2019-11-10 LAB — HEPATIC FUNCTION PANEL
ALT: 53 IU/L — ABNORMAL HIGH (ref 0–32)
AST: 18 IU/L (ref 0–40)
Albumin: 4.3 g/dL (ref 3.8–4.8)
Alkaline Phosphatase: 62 IU/L (ref 44–121)
Bilirubin Total: 0.3 mg/dL (ref 0.0–1.2)
Bilirubin, Direct: 0.13 mg/dL (ref 0.00–0.40)
Total Protein: 7.3 g/dL (ref 6.0–8.5)

## 2019-12-18 ENCOUNTER — Other Ambulatory Visit: Payer: Self-pay

## 2019-12-18 ENCOUNTER — Ambulatory Visit (LOCAL_COMMUNITY_HEALTH_CENTER): Payer: Medicaid Other

## 2019-12-18 VITALS — Wt 233.5 lb

## 2019-12-18 DIAGNOSIS — Z227 Latent tuberculosis: Secondary | ICD-10-CM | POA: Diagnosis not present

## 2019-12-18 MED ORDER — RIFAMPIN 300 MG PO CAPS: 600.0000 mg | ORAL_CAPSULE | Freq: Every day | ORAL | 0 refills | Status: AC

## 2019-12-18 NOTE — Progress Notes (Signed)
Pt states her medications have not changed and is tolerating Rifampin well. Bottle #3 of Rifampin dispensed per Dr. Sarina Ser Shah's order dated 10/04/19; and pt sent to lab for LFTs per order. Pt scheduled to return to clinic for last bottle, LFTs, and completion documents on 01/16/20 per pt request; pt to bring bottle with all remaining Rifampin to appt.

## 2019-12-19 LAB — HEPATIC FUNCTION PANEL
ALT: 27 IU/L (ref 0–32)
AST: 19 IU/L (ref 0–40)
Albumin: 4.5 g/dL (ref 3.8–4.8)
Alkaline Phosphatase: 68 IU/L (ref 44–121)
Bilirubin Total: <0.2 mg/dL (ref 0.0–1.2)
Bilirubin, Direct: <0.1 mg/dL (ref 0.00–0.40)
Total Protein: 7.2 g/dL (ref 6.0–8.5)

## 2020-01-23 ENCOUNTER — Telehealth: Payer: Self-pay

## 2020-01-23 DIAGNOSIS — Z227 Latent tuberculosis: Secondary | ICD-10-CM

## 2020-03-14 ENCOUNTER — Telehealth: Payer: Self-pay

## 2020-03-14 DIAGNOSIS — Z227 Latent tuberculosis: Secondary | ICD-10-CM

## 2020-03-14 NOTE — Telephone Encounter (Signed)
Closure letter mailed. Patient only needed 1 bottle of Rifampin to finish regimen. Richmond Campbell, RN

## 2020-04-03 ENCOUNTER — Other Ambulatory Visit: Payer: Self-pay

## 2020-04-03 ENCOUNTER — Encounter: Payer: Self-pay | Admitting: Emergency Medicine

## 2020-04-03 ENCOUNTER — Emergency Department: Payer: Medicaid Other

## 2020-04-03 ENCOUNTER — Emergency Department
Admission: EM | Admit: 2020-04-03 | Discharge: 2020-04-03 | Disposition: A | Discharge status: Home / Self Care | Payer: Medicaid Other | Attending: Emergency Medicine | Admitting: Emergency Medicine

## 2020-04-03 DIAGNOSIS — Z7984 Long term (current) use of oral hypoglycemic drugs: Secondary | ICD-10-CM | POA: Diagnosis not present

## 2020-04-03 DIAGNOSIS — E119 Type 2 diabetes mellitus without complications: Secondary | ICD-10-CM | POA: Diagnosis not present

## 2020-04-03 DIAGNOSIS — R0602 Shortness of breath: Secondary | ICD-10-CM | POA: Insufficient documentation

## 2020-04-03 DIAGNOSIS — I10 Essential (primary) hypertension: Secondary | ICD-10-CM | POA: Diagnosis not present

## 2020-04-03 DIAGNOSIS — R079 Chest pain, unspecified: Secondary | ICD-10-CM | POA: Diagnosis present

## 2020-04-03 DIAGNOSIS — Z79899 Other long term (current) drug therapy: Secondary | ICD-10-CM | POA: Insufficient documentation

## 2020-04-03 LAB — CBC
HCT: 40.4 % (ref 36.0–46.0)
Hemoglobin: 13.4 g/dL (ref 12.0–15.0)
MCH: 27.6 pg (ref 26.0–34.0)
MCHC: 33.2 g/dL (ref 30.0–36.0)
MCV: 83.1 fL (ref 80.0–100.0)
Platelets: 241 10*3/uL (ref 150–400)
RBC: 4.86 MIL/uL (ref 3.87–5.11)
RDW: 13.4 % (ref 11.5–15.5)
WBC: 6.4 10*3/uL (ref 4.0–10.5)
nRBC: 0 % (ref 0.0–0.2)

## 2020-04-03 LAB — BASIC METABOLIC PANEL
Anion gap: 6 (ref 5–15)
BUN: 10 mg/dL (ref 6–20)
CO2: 25 mmol/L (ref 22–32)
Calcium: 8.2 mg/dL — ABNORMAL LOW (ref 8.9–10.3)
Chloride: 104 mmol/L (ref 98–111)
Creatinine, Ser: 0.51 mg/dL (ref 0.44–1.00)
GFR, Estimated: >60 mL/min (ref >60)
Glucose, Bld: 227 mg/dL — ABNORMAL HIGH (ref 70–99)
Potassium: 3.6 mmol/L (ref 3.5–5.1)
Sodium: 135 mmol/L (ref 135–145)

## 2020-04-03 LAB — D-DIMER, QUANTITATIVE: D-Dimer, Quant: 1.07 ug/mL-FEU — ABNORMAL HIGH (ref 0.00–0.50)

## 2020-04-03 LAB — TROPONIN I (HIGH SENSITIVITY): Troponin I (High Sensitivity): 3 ng/L (ref <18)

## 2020-04-03 MED ORDER — IOHEXOL 350 MG/ML SOLN
75.0000 mL | Freq: Once | INTRAVENOUS | Status: AC | PRN
  Administered 2020-04-03: 75 mL via INTRAVENOUS
  Filled 2020-04-03: qty 75

## 2020-04-03 NOTE — ED Provider Notes (Signed)
[facility] Emergency Department Provider Note  Time seen: 1:36 PM  I have reviewed the triage vital signs and the nursing notes.   HISTORY  Chief Complaint Chest Pain   HPI Joan Alexander is a 35 y.o. female with a past medical history of diabetes, gallstones, hypertension, presents to the emergency department for chest pain.  According to the patient over the past week or so she has been experiencing intermittent sharp pains to the center of her chest.  States the pain only lasts a second or 2 and goes away but has been occurring intermittently.  No nausea or diaphoresis.  Does state mild shortness of breath when the pain occurs but then it resolved afterwards.  No abdominal pain.  No vomiting or diarrhea.  No fever or cough.  Denies any pleuritic pain.  States she cannot reproduce the pain with movement cough deep breathing or palpation.   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 Authorizing Provider  amLODipine (NORVASC) 10 MG tablet Take 10 mg by mouth every evening.     [provider]  amoxicillin (AMOXIL) 500 MG tablet Take 500 mg by mouth 2 (two) times daily. Unsure of dose.    [provider]   hydrochlorothiazide (HYDRODIURIL) 25 MG tablet Take 25 mg by mouth every evening.  01/26/17   [provider]  levocetirizine (XYZAL) 5 MG tablet Take 5 mg by mouth every evening. Unsure of dose    [provider]  losartan (COZAAR) 25 MG tablet Take 25 mg by mouth daily.    [provider]  metFORMIN (GLUCOPHAGE) 500 MG tablet Take 500 mg by mouth daily. Patient not taking: Reported on 11/09/2019    [provider]  montelukast (SINGULAIR) 10 MG tablet Take 10 mg by mouth daily.    [provider]  predniSONE (DELTASONE) 50 MG tablet Take 1 tablet (50 mg total) by mouth daily with breakfast. Patient not taking: Reported on 10/02/2019 11/27/18   Jene Every, MD  PROAIR HFA 108 [phone] Base) MCG/ACT inhaler Inhale 2 puffs into the lungs every 4 (four) hours as needed for wheezing.  05/12/17   [provider]  sitaGLIPtin-metformin (JANUMET) 50-500 MG tablet Take 1 tablet by mouth 2 (two) times daily with a meal. Unsure of dose.    [provider]    Allergies  Allergen Reactions   [place] Shortness Of Breath   Lisinopril Itching    History reviewed. No pertinent family history.  Social History Social History   Tobacco Use   Smoking status: Never Smoker   Smokeless tobacco: Never Used  Building services engineer Use: Never used  Substance Use Topics   Alcohol use: Not Currently    Comment: Last  use 2 - 3 years ago.   Drug use: Not Currently    Types: Marijuana    Comment: Last marijuana use 2 - 3 years ago.    Review of Systems Constitutional: Negative for fever. Cardiovascular: Positive for intermittent sharp chest pain Respiratory: Negative for shortness of breath. Gastrointestinal: Negative for abdominal pain, vomiting and diarrhea. Musculoskeletal: Negative for musculoskeletal complaints Neurological: Negative for headache All other ROS negative  ____________________________________________   PHYSICAL  EXAM:  VITAL SIGNS: ED Triage Vitals  Enc Vitals Group     BP 04/03/20 1307 (!) 175/108     Pulse Rate 04/03/20 1307 76     Resp 04/03/20 1307 18     Temp 04/03/20 1307 98.5 F (36.9 C)     Temp Source 04/03/20 1307 Oral     SpO2 04/03/20 1307 100 %     Weight 04/03/20 1309 225 lb (102.1 kg)     Height 04/03/20 1309 5\' 3"  (1.6 m)     Head Circumference --      Peak Flow --      Pain Score 04/03/20 1308 6     Pain Loc --      Pain Edu? --      Excl. in GC? --     Constitutional: Alert and oriented. Well appearing and in no distress. Eyes: Normal exam ENT      Head: Normocephalic and atraumatic.      Mouth/Throat: Mucous membranes are moist. Cardiovascular: Normal rate, regular rhythm.  Respiratory: Normal respiratory effort without tachypnea nor retractions. Breath sounds are clear  Gastrointestinal: Soft and nontender. No distention. Musculoskeletal: Nontender with normal range of motion in all extremities.  Neurologic:  Normal speech and language. No gross focal neurologic deficits Skin:  Skin is warm, dry and intact.  Psychiatric: Mood and affect are normal.   ____________________________________________    EKG  EKG viewed and interpreted by myself shows a normal sinus rhythm 86 bpm with a narrow QRS, normal axis, largely normal intervals with nonspecific ST changes.  No ST elevation.  ____________________________________________    RADIOLOGY  CTA of the chest is negative.  ____________________________________________   INITIAL IMPRESSION / ASSESSMENT AND PLAN / ED COURSE  Pertinent labs & imaging results that were available during my care of the patient were reviewed by me and considered in my medical decision making (see chart for details).   Patient presents to the emergency department for sharp chest pain intermittent over the past 1 week.  Denies any nausea or diaphoresis does state mild shortness of breath at times when the chest pain occurs.  Overall  patient appears extremely well.  Given the patient's intermittent chest pain we will check labs including a D-dimer and a cardiac enzyme.  EKG shows no specific/concerning findings.  Patient's labs show an elevated D-dimer at 1.07.  We will obtain a CTA of the chest to rule out PE.  CTA of the chest is negative for abnormalities.  Given the patient's reassuring work-up I believe the patient safe for discharge home with PCP follow-up.  Patient agreeable to plan of care.  Provided my normal chest pain return precautions.  Lynnita Imes was evaluated in Emergency Department on 04/03/2020 for the symptoms described in the history of present illness. She was evaluated in the context of the global COVID-19 pandemic, which necessitated consideration that the patient might be at risk for infection with the SARS-CoV-2 virus that causes COVID-19. Institutional protocols and algorithms that pertain to the evaluation of  patients at risk for COVID-19 are in a state of rapid change based on information released by regulatory bodies including the CDC and federal and state organizations. These policies and algorithms were followed during the patient's care in the ED.  ____________________________________________   FINAL CLINICAL IMPRESSION(S) / ED DIAGNOSES  Chest pain   Minna Antis, MD 04/03/20 1505

## 2020-04-03 NOTE — ED Notes (Signed)
Lab contacted to add on d-dimer 

## 2020-04-03 NOTE — ED Notes (Signed)
See triage note, pt reports cp that started last Sunday with intermittent shob. Denies n/v, diaphoresis.  Pt ambulatory, NAd noted

## 2020-04-03 NOTE — ED Triage Notes (Signed)
Pt to ED via POV with daughter who is also a patient. Pt reports intermittent L sided CP and SOB x 1 week, states is sharp/shooting in nature, denies radiation. Pt states hx of HTN and DM at this time. Pt A&O x4, able to talk in full and complete sentences at this time.

## 2020-04-15 NOTE — Telephone Encounter (Signed)
See 03/14/20 phone call. Richmond Campbell, RN

## 2020-04-15 NOTE — Telephone Encounter (Signed)
Lost to TB f/u Richmond Campbell, RN

## 2020-10-13 IMAGING — DX PORTABLE CHEST - 1 VIEW
1 series · 1 of 1 positions shown · non-contrast
Comparison: 06/30/2017

CLINICAL DATA: Tachycardia.

EXAM:
PORTABLE CHEST 1 VIEW

[chest ap]
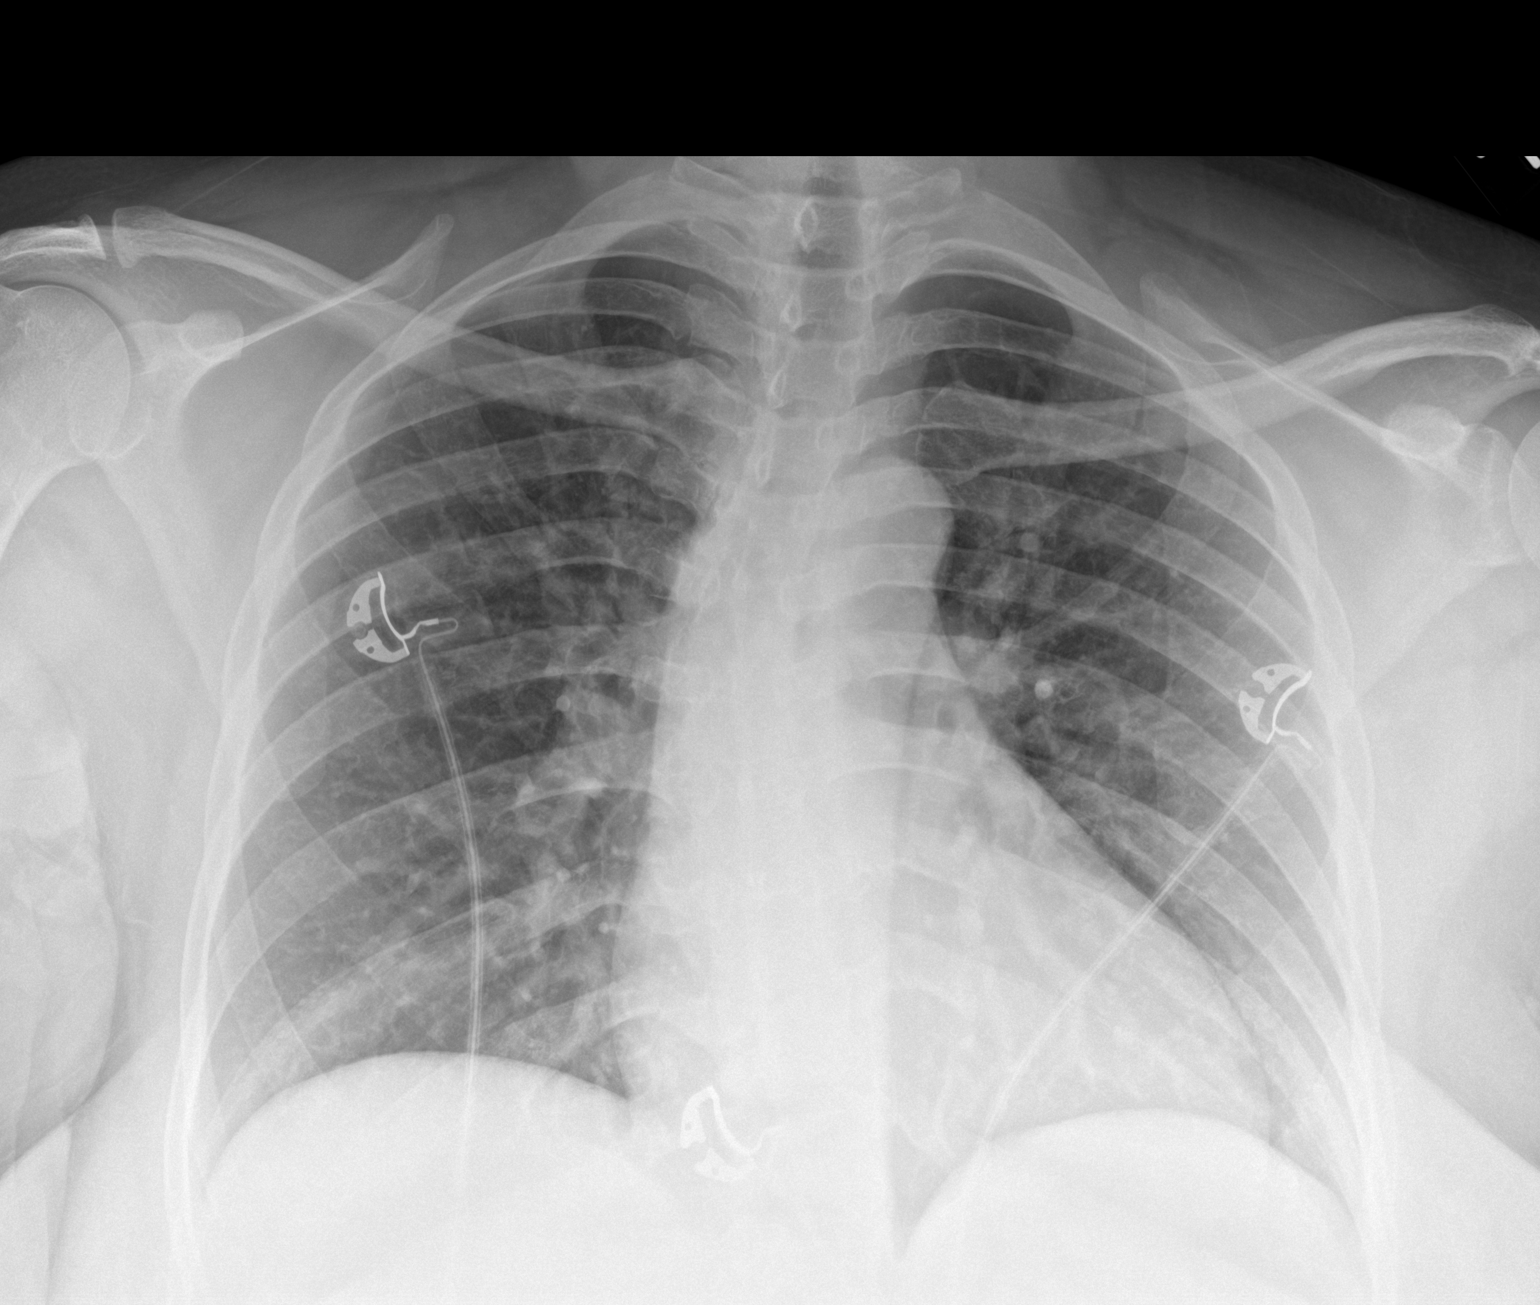

[1 of 1 positions shown; findings below may reference images not displayed]

FINDINGS: Heart size upper limits of normal. Mediastinal shadows normal.
Pulmonary vascularity within normal limits. No infiltrate, collapse
or effusion.
IMPRESSION: No active disease.

## 2020-11-08 ENCOUNTER — Ambulatory Visit: Admit: 2020-11-08 | Discharge status: Absent without leave | Payer: Medicaid Other

## 2021-01-15 ENCOUNTER — Ambulatory Visit: Admit: 2021-01-15 | Payer: Medicaid Other

## 2021-02-12 ENCOUNTER — Ambulatory Visit
Admission: EM | Admit: 2021-02-12 | Discharge: 2021-02-12 | Disposition: A | Discharge status: Home / Self Care | Payer: Medicaid Other | Attending: Emergency Medicine | Admitting: Emergency Medicine

## 2021-02-12 ENCOUNTER — Encounter: Payer: Self-pay | Admitting: Emergency Medicine

## 2021-02-12 ENCOUNTER — Other Ambulatory Visit: Payer: Self-pay

## 2021-02-12 DIAGNOSIS — H6692 Otitis media, unspecified, left ear: Secondary | ICD-10-CM

## 2021-02-12 DIAGNOSIS — I10 Essential (primary) hypertension: Secondary | ICD-10-CM

## 2021-02-12 DIAGNOSIS — J0100 Acute maxillary sinusitis, unspecified: Secondary | ICD-10-CM

## 2021-02-12 MED ORDER — AMOXICILLIN 875 MG PO TABS: 875.0000 mg | ORAL_TABLET | Freq: Two times a day (BID) | ORAL | 0 refills | Status: AC

## 2021-02-12 NOTE — ED Provider Notes (Addendum)
Roderic Palau    CSN: [id number] Arrival date & time: 02/12/21  0909      History   Chief Complaint Chief Complaint  Patient presents with   Otalgia   Sinus Pressure   Sore Throat    HPI Joan Alexander is a 36 y.o. female.  Patient presents with 1 month history of ear pain, sinus pressure, congestion, sore throat.  No dizziness, weakness, headache, fever, rash, chest pain, cough, shortness of, vomiting, diarrhea, or other symptoms.  No treatments at home.  Her medical history includes hypertension and diabetes.  Patient has not taken her blood pressure medication today.     The history is provided by the patient and medical records.   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      Home Medications    Prior to Admission medications   Medication Sig Start Date End Date Taking? Authorizing Provider  amoxicillin (AMOXIL) 875 MG tablet Take 1 tablet (875 mg total) by mouth 2 (two) times daily for 7 days. 02/12/21 02/19/21 Yes Sharion Balloon, NP  losartan (COZAAR) 25 MG tablet Take 25 mg by mouth daily.   Yes [provider]  montelukast (SINGULAIR) 10 MG tablet Take 10 mg by mouth daily.   Yes [provider]  PROAIR HFA 108 (90 Base) MCG/ACT inhaler Inhale 2  puffs into the lungs every 4 (four) hours as needed for wheezing.  05/12/17  Yes [provider]  sitaGLIPtin-metformin (JANUMET) 50-500 MG tablet Take 1 tablet by mouth 2 (two) times daily with a meal. Unsure of dose.   Yes [provider]  amLODipine (NORVASC) 10 MG tablet Take 10 mg by mouth every evening.     [provider]  hydrochlorothiazide (HYDRODIURIL) 25 MG tablet Take 25 mg by mouth every evening.  01/26/17   [provider]  levocetirizine (XYZAL) 5 MG tablet Take 5 mg by mouth every evening. Unsure of dose    [provider]  metFORMIN (GLUCOPHAGE) 500 MG tablet Take 500 mg by mouth daily. Patient not taking: Reported on 11/09/2019    [provider]  predniSONE (DELTASONE) 50 MG tablet Take 1 tablet (50 mg total) by mouth daily with breakfast. Patient not taking: Reported on 10/02/2019 11/27/18   Lavonia Drafts, MD    Family History History reviewed. No pertinent family history.  Social History Social History   Tobacco Use   Smoking status: Never   Smokeless tobacco: Never  Vaping Use   Vaping Use: Never used  Substance Use Topics   Alcohol use: Not Currently    Comment: Last use 2 - 3 years ago.   Drug use: Not Currently    Types: Marijuana    Comment: Last marijuana use 2 - 3 years ago.  Allergies   American elm and Lisinopril   Review of Systems Review of Systems  Constitutional:  Negative for chills and fever.  HENT:  Positive for congestion, ear pain and sore throat.   Respiratory:  Negative for cough and shortness of breath.   Cardiovascular:  Negative for chest pain and palpitations.  Gastrointestinal:  Negative for diarrhea and vomiting.  Skin:  Negative for color change and rash.  All other systems reviewed and are negative.   Physical Exam Triage Vital Signs ED Triage Vitals  Enc Vitals Group     BP 02/12/21 0947 (!) 174/114     Pulse Rate 02/12/21 0947 78     Resp 02/12/21 0947 18      Temp 02/12/21 0947 97.9 F (36.6 C)     Temp src --      SpO2 02/12/21 0947 97 %     Weight --      Height --      Head Circumference --      Peak Flow --      Pain Score 02/12/21 0949 6     Pain Loc --      Pain Edu? --      Excl. in [place]? --    No data found.  Updated Vital Signs BP (!) 192/124    Pulse 78    Temp 97.9 F (36.6 C)    Resp 18    LMP 01/16/2021    SpO2 97%   Visual Acuity Right Eye Distance:   Left Eye Distance:   Bilateral Distance:    Right Eye Near:   Left Eye Near:    Bilateral Near:     Physical Exam Vitals and nursing note reviewed.  Constitutional:      General: She is not in acute distress.    Appearance: She is well-developed. She is not ill-appearing.  HENT:     Right Ear: Tympanic membrane normal.     Left Ear: Tympanic membrane is erythematous.     Nose: Nose normal.     Mouth/Throat:     Mouth: Mucous membranes are moist.     Pharynx: Oropharynx is clear.  Cardiovascular:     Rate and Rhythm: Normal rate and regular rhythm.     Heart sounds: Normal heart sounds.  Pulmonary:     Effort: Pulmonary effort is normal. No respiratory distress.     Breath sounds: Normal breath sounds.  Musculoskeletal:     Cervical back: Neck supple.  Skin:    General: Skin is warm and dry.  Neurological:     Mental Status: She is alert.  Psychiatric:        Mood and Affect: Mood normal.        Behavior: Behavior normal.     UC Treatments / Results  Labs (all labs ordered are listed, but only abnormal results are displayed) Labs Reviewed - No data to display  EKG   Radiology No results found.  Procedures Procedures (including critical care time)  Medications Ordered in UC Medications - No data to display  Initial Impression / Assessment and Plan / UC Course  I have reviewed the triage vital signs and the nursing notes.  Pertinent labs & imaging results that were available during my care of the patient were reviewed by me and considered  in my medical decision making (see chart for details).    Left otitis media, acute sinusitis, elevated blood pressure with HTN. Patient's blood pressure is elevated today.  She has not taken her antihypertensive medication today and reports she only takes it occasionally.  She has no chest pain, SOB, dizziness, or weakness.  ED precautions discussed.  Patient has had her sinus symptoms for 1 month.  Treating with amoxicillin.  Instructed patient to follow up with her PCP if her symptoms are not improving.  Also discussed with patient that her blood pressure is elevated today and needs to be rechecked by PCP this week.  Education provided on managing hypertension.  She declines transfer to the ED; she states she will take her blood pressure medication when she gets home.  She agrees to plan of care.    Final Clinical Impressions(s) / UC Diagnoses   Final diagnoses:  Left otitis media, unspecified otitis media type  Acute non-recurrent maxillary sinusitis  Elevated blood pressure reading in office with diagnosis of hypertension     Discharge Instructions      Take the amoxicillin as directed.  Follow up with your primary care provider if your symptoms are not improving.    Your blood pressure is elevated today at 174/114; repeat 192/127.  Please have this rechecked by your primary care provider this week.  Go to the emergency department if you have dizziness, weakness, chest pain, shortness of breath, or other concerning symptoms.            ED Prescriptions     Medication Sig Dispense Auth. Provider   amoxicillin (AMOXIL) 875 MG tablet Take 1 tablet (875 mg total) by mouth 2 (two) times daily for 7 days. 14 tablet Sharion Balloon, NP      PDMP not reviewed this encounter.   Sharion Balloon, NP 02/12/21 1043    Sharion Balloon, NP 02/12/21 1050

## 2021-02-12 NOTE — ED Triage Notes (Signed)
Pt presents with ST, sinus pressure, and bilateral ear pain x 1 month.

## 2021-02-12 NOTE — Discharge Instructions (Addendum)
Take the amoxicillin as directed.  Follow up with your primary care provider if your symptoms are not improving.    Your blood pressure is elevated today at 174/114; repeat 192/127.  Please have this rechecked by your primary care provider this week.  Go to the emergency department if you have dizziness, weakness, chest pain, shortness of breath, or other concerning symptoms.

## 2021-04-01 ENCOUNTER — Other Ambulatory Visit: Payer: Self-pay

## 2021-04-01 ENCOUNTER — Emergency Department
Admission: EM | Admit: 2021-04-01 | Discharge: 2021-04-01 | Disposition: A | Discharge status: Home / Self Care | Payer: Medicaid Other | Attending: Emergency Medicine | Admitting: Emergency Medicine

## 2021-04-01 ENCOUNTER — Emergency Department: Payer: Medicaid Other

## 2021-04-01 DIAGNOSIS — R519 Headache, unspecified: Secondary | ICD-10-CM | POA: Diagnosis present

## 2021-04-01 DIAGNOSIS — I10 Essential (primary) hypertension: Secondary | ICD-10-CM | POA: Insufficient documentation

## 2021-04-01 DIAGNOSIS — Z9114 Patient's other noncompliance with medication regimen: Secondary | ICD-10-CM | POA: Diagnosis not present

## 2021-04-01 DIAGNOSIS — R0789 Other chest pain: Secondary | ICD-10-CM

## 2021-04-01 LAB — CBC
HCT: 44.3 % (ref 36.0–46.0)
Hemoglobin: 14.7 g/dL (ref 12.0–15.0)
MCH: 26.9 pg (ref 26.0–34.0)
MCHC: 33.2 g/dL (ref 30.0–36.0)
MCV: 81 fL (ref 80.0–100.0)
Platelets: 244 10*3/uL (ref 150–400)
RBC: 5.47 MIL/uL — ABNORMAL HIGH (ref 3.87–5.11)
RDW: 13.1 % (ref 11.5–15.5)
WBC: 6.1 10*3/uL (ref 4.0–10.5)
nRBC: 0 % (ref 0.0–0.2)

## 2021-04-01 LAB — BASIC METABOLIC PANEL
Anion gap: 6 (ref 5–15)
BUN: 8 mg/dL (ref 6–20)
CO2: 30 mmol/L (ref 22–32)
Calcium: 8.9 mg/dL (ref 8.9–10.3)
Chloride: 100 mmol/L (ref 98–111)
Creatinine, Ser: 0.54 mg/dL (ref 0.44–1.00)
GFR, Estimated: >60 mL/min (ref >60)
Glucose, Bld: 193 mg/dL — ABNORMAL HIGH (ref 70–99)
Potassium: 3.4 mmol/L — ABNORMAL LOW (ref 3.5–5.1)
Sodium: 136 mmol/L (ref 135–145)

## 2021-04-01 LAB — TROPONIN I (HIGH SENSITIVITY): Troponin I (High Sensitivity): 4 ng/L (ref <18)

## 2021-04-01 MED ORDER — ACETAMINOPHEN 500 MG PO TABS
1000.0000 mg | ORAL_TABLET | Freq: Once | ORAL | Status: AC
  Administered 2021-04-01: 1000 mg via ORAL
  Filled 2021-04-01: qty 2

## 2021-04-01 MED ORDER — LOSARTAN POTASSIUM 50 MG PO TABS
25.0000 mg | ORAL_TABLET | Freq: Once | ORAL | Status: AC
  Administered 2021-04-01: 25 mg via ORAL
  Filled 2021-04-01: qty 1

## 2021-04-01 MED ORDER — KETOROLAC TROMETHAMINE 30 MG/ML IJ SOLN
15.0000 mg | Freq: Once | INTRAMUSCULAR | Status: AC
  Administered 2021-04-01: 15 mg via INTRAVENOUS
  Filled 2021-04-01: qty 1

## 2021-04-01 MED ORDER — PROCHLORPERAZINE EDISYLATE 10 MG/2ML IJ SOLN
10.0000 mg | Freq: Once | INTRAMUSCULAR | Status: AC
Start: 2021-04-01 — End: 2021-04-01
  Administered 2021-04-01: 10 mg via INTRAVENOUS
  Filled 2021-04-01: qty 2

## 2021-04-01 MED ORDER — LOSARTAN POTASSIUM 25 MG PO TABS: 25.0000 mg | ORAL_TABLET | Freq: Every day | ORAL | 0 refills | Status: DC

## 2021-04-01 NOTE — ED Triage Notes (Signed)
Pt reports that her headache started last pm, chest tightness this am, states that her bp has been elevated despite taking her medications, states that she was unable to get in to see her dr today and thought it would be safer to be checked, pt states her last bp medication change was 6 mos ago ?

## 2021-04-01 NOTE — ED Provider Notes (Signed)
? ?[facility] ?Provider Note ? ? ? Event Date/Time  ? First MD Initiated Contact with Patient 04/01/21 [id number]   ?  (approximate) ? ? ?History  ? ?Hypertension, Headache, and Chest Pain ? ? ?HPI ? ?Joan Alexander is a 36 y.o. female who presents to the ED for evaluation of Hypertension, Headache, and Chest Pain ?  ?Patient presents to the ED for evaluation of multiple complaints.  She reports weeks of high blood pressure in the setting of noncompliance.  She reports taking her amlodipine, but does not like her HCTZ and has not been taking her losartan for a few days for no specific reason. ? ?She reports an aching headache since last night, for about 12 hours, transiently improved with Tylenol and ibuprofen.  Denies any vision changes, falls or syncope, neck stiffness or fever. ? ?Reports feeling chest pain for a few minutes earlier this morning while she was waking up.  Denies chest pain right now.  Denies dyspnea on exertion, cough or shortness of breath. ? ?Physical Exam  ? ?Triage Vital Signs: ?ED Triage Vitals [04/01/21 0835]  ?Enc Vitals Group  ?   BP (!) 193/115  ?   Pulse Rate 83  ?   Resp 16  ?   Temp 97.9 ?F (36.6 ?C)  ?   Temp Source Oral  ?   SpO2 99 %  ?   Weight 236 lb (107 kg)  ?   Height 5\' 4"  (1.626 m)  ?   Head Circumference   ?   Peak Flow   ?   Pain Score 8  ?   Pain Loc   ?   Pain Edu?   ?   Excl. in [place]?   ? ? ?Most recent vital signs: ?Vitals:  ? 04/01/21 1000 04/01/21 1016  ?BP: (!) 177/101 (!) 117/101  ?Pulse: 85 77  ?Resp: 17 18  ?Temp:    ?SpO2: 100% 100%  ? ? ?General: Awake, no distress.  Morbidly obese.  Pleasant and conversational. ?CV:  Good peripheral perfusion. RRR ?Resp:  Normal effort. CTAB ?Abd:  No distention.  ?MSK:  No deformity noted.  ?Neuro:  No focal deficits appreciated. Cranial nerves II through XII intact ?5/5 strength and sensation in all 4 extremities ?Other:   ? ? ?ED Results / Procedures / Treatments  ? ?Labs ?(all labs ordered are listed, but only  abnormal results are displayed) ?Labs Reviewed  ?BASIC METABOLIC PANEL - Abnormal; Notable for the following components:  ?    Result Value  ? Potassium 3.4 (*)   ? Glucose, Bld 193 (*)   ? All other components within normal limits  ?CBC - Abnormal; Notable for the following components:  ? RBC 5.47 (*)   ? All other components within normal limits  ?POC URINE PREG, ED  ?TROPONIN I (HIGH SENSITIVITY)  ?TROPONIN I (HIGH SENSITIVITY)  ? ? ?EKG ?Sinus rhythm, rate of 79 bpm.  Leftward axis and normal intervals.  Nonspecific ST changes inferiorly without STEMI. ? ?RADIOLOGY ?CXR reviewed by me without evidence of acute cardiopulmonary pathology. ?CT head reviewed by me without evidence of acute intracranial pathology. ? ?Official radiology report(s): ?DG Chest 2 View ? ?Result Date: 04/01/2021 ?CLINICAL DATA:  Chest tightness. Headache, chest tightness, elevated blood pressure. EXAM: CHEST - 2 VIEW COMPARISON:  Prior chest radiographs 04/03/2020 and earlier. CT angiogram chest 04/03/2020. FINDINGS: Heart size within normal limits. No appreciable airspace consolidation or pulmonary edema. No evidence of pleural effusion or  pneumothorax. No acute bony abnormality identified. Thoracic dextrocurvature. IMPRESSION: No evidence of acute cardiopulmonary abnormality. Electronically Signed   By: Jackey Loge D.O.   On: 04/01/2021 09:09  ? ?CT HEAD WO CONTRAST ( ) ? ?Result Date: 04/01/2021 ?CLINICAL DATA:  Headache, hypertension EXAM: CT HEAD WITHOUT CONTRAST TECHNIQUE: Contiguous axial images were obtained from the base of the skull through the vertex without intravenous contrast. RADIATION DOSE REDUCTION: This exam was performed according to the departmental dose-optimization program which includes automated exposure control, adjustment of the mA and/or kV according to patient size and/or use of iterative reconstruction technique. COMPARISON:  01/09/2018 FINDINGS: Brain: No evidence of acute infarction, hemorrhage,  hydrocephalus, extra-axial collection or mass lesion/mass effect. Vascular: No hyperdense vessel or unexpected calcification. Skull: Normal. Negative for fracture or focal lesion. Sinuses/Orbits: No acute finding. Other: None. IMPRESSION: No acute intracranial process. Electronically Signed   By: Duanne Guess D.O.   On: 04/01/2021 09:15   ? ?PROCEDURES and INTERVENTIONS: ? ?.1-3 Lead EKG Interpretation ?Performed by: Delton Prairie, MD ?Authorized by: Delton Prairie, MD  ? ?  Interpretation: normal   ?  ECG rate:  80 ?  ECG rate assessment: normal   ?  Rhythm: sinus rhythm   ?  Ectopy: none   ?  Conduction: normal   ? ?Medications  ?acetaminophen (TYLENOL) tablet 1,000 mg (1,000 mg Oral Given 04/01/21 0930)  ?ketorolac (TORADOL) 30 MG/ML injection 15 mg (15 mg Intravenous Given 04/01/21 0937)  ?prochlorperazine (COMPAZINE) injection 10 mg (10 mg Intravenous Given 04/01/21 0931)  ?losartan (COZAAR) tablet 25 mg (25 mg Oral Given 04/01/21 0931)  ? ? ? ?IMPRESSION / MDM / ASSESSMENT AND PLAN / ED COURSE  ?I reviewed the triage vital signs and the nursing notes. ? ?Patient presents to the ED for evaluation of chest pain and headache in the setting of hypertension due to medication noncompliance, ultimately suitable for outpatient management.  Looks well to me with a normal examination.  No evidence of neurologic or vascular deficits.  CT head without evidence of ICH.  Blood work is reassuring with a negative troponin, normal CBC and marginally hypokalemic with otherwise normal metabolic panel.  CXR is clear.  BP improved with her home dose of losartan and headache/chest pain resolved with Toradol and Compazine.  Doubt ACS or need for further diagnostics.  She is requesting discharge after resolution of her symptoms, which I think is reasonable.  I provided a prescription for losartan and discussed following up with her PCP, as well as return precautions for the ED. ? ?Clinical Course as of 04/01/21 1450  ?Tue Apr 01, 2021   ?1011 Reassessed.  Patient was feeling better and is requesting discharge. [DS]  ?  ?Clinical Course User Index ?[DS] Delton Prairie, MD  ? ? ? ?FINAL CLINICAL IMPRESSION(S) / ED DIAGNOSES  ? ?Final diagnoses:  ?Primary hypertension  ?Other chest pain  ?Noncompliance with medication regimen  ? ? ? ?Rx / DC Orders  ? ?ED Discharge Orders   ? ?      Ordered  ?  losartan (COZAAR) 25 MG tablet  Daily       ? 04/01/21 1010  ? ?  ?  ? ?  ? ? ? ?Note:  This document was prepared using Dragon voice recognition software and may include unintentional dictation errors. ?  ?Delton Prairie, MD ?04/01/21 1452 ? ?

## 2021-04-01 NOTE — ED Notes (Signed)
Poc urine not completed at this time. Documentation completed in error. Pt refused to provide sample.  ?

## 2021-04-01 NOTE — ED Notes (Signed)
Notepad for signature not available.  ?

## 2021-11-04 IMAGING — CR DG CHEST 1V
1 series · 1 of 1 positions shown · non-contrast
Comparison: March 07, 2019

CLINICAL DATA: Positive tuberculin skin test

EXAM:
CHEST  1 VIEW

[chest pa]
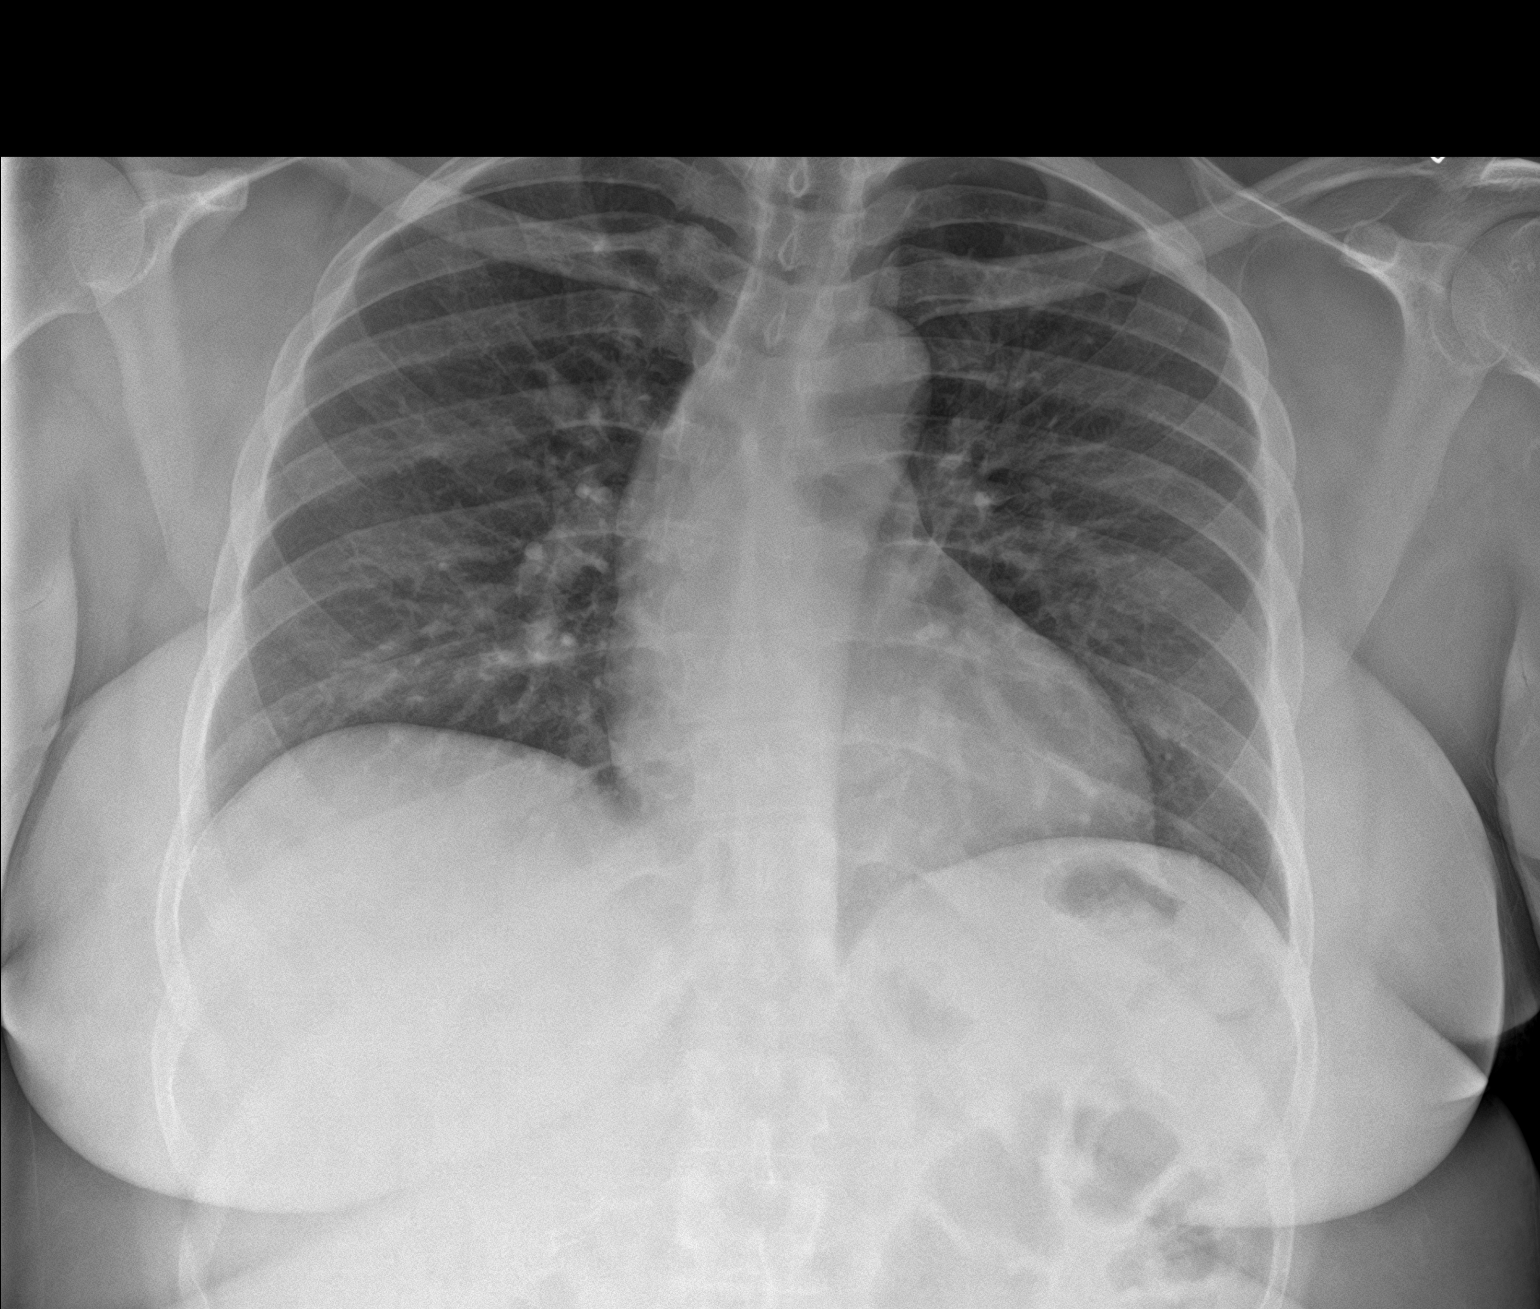

[1 of 1 positions shown; findings below may reference images not displayed]

FINDINGS: Lungs are clear. Heart size and pulmonary vascularity are normal. No
adenopathy. No bone lesions.
IMPRESSION: No abnormality noted. No findings indicative of metabolically active
pulmonary tuberculosis.

## 2021-11-12 ENCOUNTER — Ambulatory Visit: Payer: Self-pay

## 2021-11-12 ENCOUNTER — Ambulatory Visit
Admission: EM | Admit: 2021-11-12 | Discharge: 2021-11-12 | Disposition: A | Discharge status: Home / Self Care | Payer: Medicaid Other | Attending: Urgent Care | Admitting: Urgent Care

## 2021-11-12 DIAGNOSIS — J069 Acute upper respiratory infection, unspecified: Secondary | ICD-10-CM | POA: Diagnosis present

## 2021-11-12 DIAGNOSIS — Z1152 Encounter for screening for COVID-19: Secondary | ICD-10-CM | POA: Insufficient documentation

## 2021-11-12 LAB — RESP PANEL BY RT-PCR (RSV, FLU A&B, COVID)  RVPGX2
Influenza A by PCR: NEGATIVE
Influenza B by PCR: NEGATIVE
Resp Syncytial Virus by PCR: NEGATIVE
SARS Coronavirus 2 by RT PCR: NEGATIVE

## 2021-11-12 LAB — POCT RAPID STREP A (OFFICE): Rapid Strep A Screen: NEGATIVE

## 2021-11-12 NOTE — ED Provider Notes (Signed)
Renaldo Fiddler    CSN: [id number] Arrival date & time: 11/12/21  1107      History   Chief Complaint Chief Complaint  Patient presents with   Hoarse   Nasal Congestion    HPI Joan Alexander is a 36 y.o. female.   HPI  Presents to UC with complaint of symptoms x3 days.  She is accompanied by her 2 children who present with similar symptoms.  Patient reports hoarseness and nasal congestion.  She has elevated blood pressure as measured in clinic and reports not having taken her medication today.  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      Home Medications    Prior to Admission medications   Medication Sig Start Date End Date Taking? Authorizing Provider  Lancet Devices (SIMPLE DIAGNOSTICS LANCING DEV) MISC Use as directed 07/12/21  Yes [provider]  ACCU-CHEK AVIVA PLUS test strip 1 each daily. 11/11/21   [provider]  Accu-Chek Softclix Lancets lancets SMARTSIG:Topical 07/12/21   [provider]  amLODipine (NORVASC) 10 MG tablet Take 10 mg by mouth every evening.     [provider]  hydrochlorothiazide (HYDRODIURIL) 25 MG tablet Take 25 mg by mouth every evening.  01/26/17   [provider]  [employer] PEN NEEDLES 31G X 6  MM MISC SMARTSIG:pen needle injection Daily 09/01/21   [provider]  levocetirizine (XYZAL) 5 MG tablet Take 5 mg by mouth every evening. Unsure of dose    [provider]  losartan (COZAAR) 25 MG tablet Take 25 mg by mouth daily.    [provider]  losartan (COZAAR) 25 MG tablet Take 1 tablet (25 mg total) by mouth daily. 04/01/21 04/01/22  Delton Prairie, MD  losartan (COZAAR) 50 MG tablet Take 50 mg by mouth daily. 10/01/21   [provider]  metFORMIN (GLUCOPHAGE) 500 MG tablet Take 500 mg by mouth daily. Patient not taking: Reported on 11/09/2019    [provider]  montelukast (SINGULAIR) 10 MG tablet Take 10 mg by mouth daily.    [provider]  predniSONE (DELTASONE) 50 MG tablet Take 1 tablet (50 mg total) by mouth daily with breakfast. Patient not taking: Reported on 10/02/2019 11/27/18   Jene Every, MD  PROAIR HFA 108 [phone] Base) MCG/ACT inhaler Inhale 2 puffs into the lungs every 4 (four) hours as needed for wheezing.  05/12/17   [provider]  sitaGLIPtin-metformin (JANUMET) 50-500 MG tablet Take 1 tablet by mouth 2 (two) times daily with a meal. Unsure of dose.    [provider]  VICTOZA 18 MG/3ML SOPN Inject into the skin. 09/30/21   [provider]    Family History History reviewed. No pertinent family history.  Social History Social History  Tobacco Use   Smoking status: Never   Smokeless tobacco: Never  Vaping Use   Vaping Use: Never used  Substance Use Topics   Alcohol use: Not Currently    Comment: Last use 2 - 3 years ago.   Drug use: Not Currently    Types: Marijuana    Comment: Last marijuana use 2 - 3 years ago.     Allergies   American elm and Lisinopril   Review of Systems Review of Systems   Physical Exam Triage Vital Signs ED Triage Vitals  Enc Vitals Group     BP 11/12/21 1153 (!) 176/124     Pulse Rate 11/12/21 1153 86     Resp 11/12/21 1153 18     Temp  11/12/21 1153 98 F (36.7 C)     Temp Source 11/12/21 1153 Oral     SpO2 11/12/21 1153 97 %     Weight --      Height --      Head Circumference --      Peak Flow --      Pain Score 11/12/21 1201 0     Pain Loc --      Pain Edu? --      Excl. in [place]? --    No data found.  Updated Vital Signs BP (!) 176/124 (BP Location: Left Arm) Comment: Pt haven't taken blood pressure med today.  Pulse 86   Temp 98 F (36.7 C) (Oral)   Resp 18   LMP  (LMP Unknown)   SpO2 97%   Visual Acuity Right Eye Distance:   Left Eye Distance:   Bilateral Distance:    Right Eye Near:   Left Eye Near:    Bilateral Near:     Physical Exam Vitals reviewed.  Constitutional:      Appearance: Normal appearance.  HENT:     Mouth/Throat:     Pharynx: Posterior oropharyngeal erythema present. No oropharyngeal exudate.     Tonsils: No tonsillar exudate. 2+ on the right. 2+ on the left.  Skin:    General: Skin is warm and dry.  Neurological:     General: No focal deficit present.     Mental Status: She is alert and oriented to person, place, and time.  Psychiatric:        Mood and Affect: Mood normal.        Behavior: Behavior normal.      UC Treatments / Results  Labs (all labs ordered are listed, but only abnormal results are displayed) Labs Reviewed - No data to display  EKG   Radiology No results found.  Procedures Procedures (including critical care time)  Medications Ordered in UC Medications - No data to display  Initial Impression / Assessment and Plan / UC Course  I have reviewed the triage vital signs and the nursing notes.  Pertinent labs & imaging results that were available during my care of the patient were reviewed by me and considered in my medical decision making (see chart for details).    Rapid strep is negative.  Viral process is suspected.  Respiratory swab is obtained and pending.  Lungs CTAB.  Mild pharyngeal erythema is present without peritonsillar  exudates.  Recommending OTC treatment as needed for symptom control.   Final Clinical Impressions(s) / UC Diagnoses   Final diagnoses:  None   Discharge Instructions   None    ED Prescriptions   None    PDMP not reviewed this encounter.  Rose Phi, [place] 11/12/21 1216

## 2021-11-12 NOTE — Discharge Instructions (Signed)
Follow up here or with your primary care provider if your symptoms are worsening or not improving.     

## 2021-11-12 NOTE — ED Triage Notes (Signed)
Pt. Presents to UC w/ c/o hoarseness and nasal congestion for the past 3 days.

## 2022-12-17 ENCOUNTER — Other Ambulatory Visit: Payer: Self-pay | Admitting: Physician Assistant

## 2022-12-17 DIAGNOSIS — R748 Abnormal levels of other serum enzymes: Secondary | ICD-10-CM

## 2022-12-23 ENCOUNTER — Ambulatory Visit
Admission: RE | Admit: 2022-12-23 | Discharge: 2022-12-23 | Disposition: A | Discharge status: Home / Self Care | Payer: Medicaid Other | Source: Ambulatory Visit | Attending: Physician Assistant | Admitting: Physician Assistant

## 2022-12-23 DIAGNOSIS — R748 Abnormal levels of other serum enzymes: Secondary | ICD-10-CM | POA: Insufficient documentation

## 2023-03-01 ENCOUNTER — Other Ambulatory Visit: Payer: Self-pay | Admitting: Nurse Practitioner

## 2023-03-01 DIAGNOSIS — K760 Fatty (change of) liver, not elsewhere classified: Secondary | ICD-10-CM

## 2023-03-01 DIAGNOSIS — R7989 Other specified abnormal findings of blood chemistry: Secondary | ICD-10-CM

## 2023-03-01 DIAGNOSIS — R748 Abnormal levels of other serum enzymes: Secondary | ICD-10-CM

## 2023-03-08 ENCOUNTER — Ambulatory Visit: Admission: RE | Admit: 2023-03-08 | Payer: Medicaid Other | Source: Ambulatory Visit

## 2023-03-19 ENCOUNTER — Ambulatory Visit
Admission: RE | Admit: 2023-03-19 | Discharge: 2023-03-19 | Disposition: A | Discharge status: Home / Self Care | Source: Ambulatory Visit | Attending: Nurse Practitioner | Admitting: Nurse Practitioner

## 2023-03-19 ENCOUNTER — Encounter: Payer: Self-pay | Admitting: Radiology

## 2023-03-19 DIAGNOSIS — R748 Abnormal levels of other serum enzymes: Secondary | ICD-10-CM | POA: Diagnosis present

## 2023-03-19 DIAGNOSIS — K760 Fatty (change of) liver, not elsewhere classified: Secondary | ICD-10-CM

## 2023-03-19 DIAGNOSIS — R7989 Other specified abnormal findings of blood chemistry: Secondary | ICD-10-CM | POA: Diagnosis present

## 2023-03-19 MED ORDER — IOHEXOL 300 MG/ML  SOLN
100.0000 mL | Freq: Once | INTRAMUSCULAR | Status: AC | PRN
  Administered 2023-03-19: 100 mL via INTRAVENOUS

## 2023-04-05 ENCOUNTER — Other Ambulatory Visit: Payer: Self-pay | Admitting: Nurse Practitioner

## 2023-04-05 DIAGNOSIS — R935 Abnormal findings on diagnostic imaging of other abdominal regions, including retroperitoneum: Secondary | ICD-10-CM

## 2023-04-09 ENCOUNTER — Ambulatory Visit
Admission: RE | Admit: 2023-04-09 | Discharge: 2023-04-09 | Disposition: A | Discharge status: Home / Self Care | Source: Ambulatory Visit | Attending: Nurse Practitioner | Admitting: Nurse Practitioner

## 2023-04-09 DIAGNOSIS — R935 Abnormal findings on diagnostic imaging of other abdominal regions, including retroperitoneum: Secondary | ICD-10-CM | POA: Diagnosis present

## 2023-04-09 MED ORDER — GADOBUTROL 1 MMOL/ML IV SOLN
10.0000 mL | Freq: Once | INTRAVENOUS | Status: AC | PRN
Start: 2023-04-09 — End: 2023-04-09
  Administered 2023-04-09: 10 mL via INTRAVENOUS

## 2023-05-15 ENCOUNTER — Emergency Department
Admission: EM | Admit: 2023-05-15 | Discharge: 2023-05-15 | Disposition: A | Discharge status: Home / Self Care | Attending: Emergency Medicine | Admitting: Emergency Medicine

## 2023-05-15 ENCOUNTER — Emergency Department

## 2023-05-15 ENCOUNTER — Other Ambulatory Visit: Payer: Self-pay

## 2023-05-15 DIAGNOSIS — R0789 Other chest pain: Secondary | ICD-10-CM | POA: Insufficient documentation

## 2023-05-15 DIAGNOSIS — I10 Essential (primary) hypertension: Secondary | ICD-10-CM | POA: Diagnosis not present

## 2023-05-15 DIAGNOSIS — R079 Chest pain, unspecified: Secondary | ICD-10-CM

## 2023-05-15 LAB — CBC
HCT: 41.5 % (ref 36.0–46.0)
Hemoglobin: 14.2 g/dL (ref 12.0–15.0)
MCH: 28.5 pg (ref 26.0–34.0)
MCHC: 34.2 g/dL (ref 30.0–36.0)
MCV: 83.2 fL (ref 80.0–100.0)
Platelets: 250 10*3/uL (ref 150–400)
RBC: 4.99 MIL/uL (ref 3.87–5.11)
RDW: 12.9 % (ref 11.5–15.5)
WBC: 6.9 10*3/uL (ref 4.0–10.5)
nRBC: 0 % (ref 0.0–0.2)

## 2023-05-15 LAB — TROPONIN I (HIGH SENSITIVITY)
Troponin I (High Sensitivity): 4 ng/L (ref <18)
Troponin I (High Sensitivity): 4 ng/L (ref <18)

## 2023-05-15 LAB — BASIC METABOLIC PANEL WITH GFR
Anion gap: 8 (ref 5–15)
BUN: 11 mg/dL (ref 6–20)
CO2: 25 mmol/L (ref 22–32)
Calcium: 8.9 mg/dL (ref 8.9–10.3)
Chloride: 105 mmol/L (ref 98–111)
Creatinine, Ser: 0.77 mg/dL (ref 0.44–1.00)
GFR, Estimated: >60 mL/min (ref >60)
Glucose, Bld: 147 mg/dL — ABNORMAL HIGH (ref 70–99)
Potassium: 3.2 mmol/L — ABNORMAL LOW (ref 3.5–5.1)
Sodium: 138 mmol/L (ref 135–145)

## 2023-05-15 NOTE — ED Triage Notes (Signed)
 Pt arrives via POV with c/o chest pain that started last night that was intermitent, but has been constant for the last 2 hours. Pt states the pain is under their left breast that is sharp, left shoulder (started 40 mins) that is sore and left elbow (20 mins ago) that is numb to their fingertips. Pt denies vomiting/SOB, but has been dealing with nausea since this morning.

## 2023-05-15 NOTE — ED Provider Notes (Signed)
 [facility] Provider Note    Event Date/Time   First MD Initiated Contact with Patient 05/15/23 1710     (approximate)   History   Chest Pain   HPI  Tashauna Czerwinski is a 38 y.o. female who presents to the emergency department today because of concerns for chest pain.  Pain is located in her left lower chest.  It is sharp.  It has been intermittent over the past few few days.  Today however it became constant.  The pain did radiate into her left shoulder and she had some soreness and numbness in her left forearm.  The time my exam she is feeling better.  Patient denies any associated shortness of breath.  Does states she has a history of high blood pressure.  She has noted over the past few days that her blood pressure has been elevated.  She is under a lot of stress.  Has had similar pain when her blood pressure has been elevated in the past.  Denies any travel.  No smoking or birth control.  No shortness of breath.     Physical Exam   Triage Vital Signs: ED Triage Vitals  Encounter Vitals Group     BP 05/15/23 1646 (!) 179/114     Systolic BP Percentile --      Diastolic BP Percentile --      Pulse Rate 05/15/23 1646 75     Resp 05/15/23 1646 16     Temp 05/15/23 1646 98.4 F (36.9 C)     Temp Source 05/15/23 1646 Oral     SpO2 05/15/23 1646 99 %     Weight 05/15/23 1651 219 lb (99.3 kg)     Height 05/15/23 1651 5\' 4"  (1.626 m)     Head Circumference --      Peak Flow --      Pain Score 05/15/23 1648 7     Pain Loc --      Pain Education --      Exclude from Growth Chart --     Most recent vital signs: Vitals:   05/15/23 1646  BP: (!) 179/114  Pulse: 75  Resp: 16  Temp: 98.4 F (36.9 C)  SpO2: 99%   General: Awake, alert, oriented. CV:  Good peripheral perfusion. Regular rate and rhythm. Resp:  Normal effort. Lungs clear. Abd:  No distention.    ED Results / Procedures / Treatments   Labs (all labs ordered are listed, but only  abnormal results are displayed) Labs Reviewed  BASIC METABOLIC PANEL WITH GFR - Abnormal; Notable for the following components:      Result Value   Potassium 3.2 (*)    Glucose, Bld 147 (*)    All other components within normal limits  CBC  POC URINE PREG, ED  TROPONIN I (HIGH SENSITIVITY)  TROPONIN I (HIGH SENSITIVITY)     EKG  I, Marylynn Soho, attending physician, personally viewed and interpreted this EKG  EKG Time: 1645 Rate: 76 Rhythm: normal sinus rhythm Axis: left axis deviation Intervals: qtc 369 QRS: narrow ST changes: no  st elevation Impression: abnormal ekg    RADIOLOGY I independently interpreted and visualized the CXR. My interpretation: No pneumonia Radiology interpretation: IMPRESSION: 1. No acute fracture or traumatic subluxation. 2. Stable grade 1 anterolisthesis at L4-5. 3. Mild foraminal narrowing bilaterally at L3-4. 4. Moderate facet hypertrophy at L2-3, L3-4, L4-5, and L5-S1.    PROCEDURES:  Critical Care performed: No  MEDICATIONS ORDERED IN ED: Medications - No data to display   IMPRESSION / MDM / ASSESSMENT AND PLAN / ED COURSE  I reviewed the triage vital signs and the nursing notes.                              Differential diagnosis includes, but is not limited to, ACS, pneumonia, PE, dissection, esophagitis  Patient's presentation is most consistent with acute presentation with potential threat to life or bodily function.   Patient presented to the emergency department today because of concerns for chest pain.  On exam patient's lungs were clear, no concerning arrhythmia or murmurs.  EKG without ST elevation.  Blood work showed troponin negative x 2.  Chest x-ray without concerning pneumonia or pneumothorax.  This time I do have very low suspicion for PE.  Patient does states she is under a lot of stress recently and I do think that is likely playing a role.  Patient's blood pressure did improve while here in the emergency  department and her symptoms improved as well.  At this time I think it is reasonable for patient be discharged.  Encouraged patient to follow-up with primary care.      FINAL CLINICAL IMPRESSION(S) / ED DIAGNOSES   Final diagnoses:  Nonspecific chest pain  Hypertension, unspecified type    Note:  This document was prepared using Dragon voice recognition software and may include unintentional dictation errors.    Marylynn Soho, MD 05/15/23 [phone]

## 2023-06-08 ENCOUNTER — Other Ambulatory Visit: Payer: Self-pay | Admitting: Obstetrics and Gynecology

## 2023-06-08 DIAGNOSIS — Z01818 Encounter for other preprocedural examination: Secondary | ICD-10-CM

## 2023-06-08 NOTE — H&P (Signed)
 GYN H&P   CC: pre-op   HPI: Ivan Getter 38 y.o. [id number] with a hx of T2DM (A1c 6.8 on 06/01/23), HTN, obesity, hepatic steatosis, stress urinary incontinence, and elevated LFTs who presents for pre-op visit.    Reports no bleeding since January. Reports pain is intermittent. Cramping last week like a period but without bleeding.   Wants to proceed with hysterectomy.  [facility]: [facility]  ROS: All other systems reviewed and negative   PMHx: Past Medical History:  Diagnosis Date   Diabetes mellitus without complication (CMS/HHS-HCC)    Fibroid 1015   Hypertension    Rheumatoid arthritis (CMS/HHS-HCC) 2025      PSHx: Past Surgical History:  Procedure Laterality Date   CHOLECYSTECTOMY  2021   LAPAROSCOPIC CHOLECYSTECTOMY     TONSILLECTOMY       OBHx: OB History  Gravida Para Term Preterm AB Living  2 2 2     2   SAB IAB Ectopic Molar Multiple Live Births            2    # Outcome Date GA Lbr Len/2nd Weight Sex Type Anes PTL Lv  2 Term      Vag-Spont   LIV  1 Term      Vag-Spont   LIV     GYN Hx: - LMP: Patient's last menstrual period was 01/13/2023 (approximate).  - Menarche: Age 65 - Menses: Irregular- 2-4 cycles per year, + significant associated cramping.  - Pap hx: Denies any history of abnormals, last 04/27/23 NILM with neg HRHPV. Never had any excisional procedures - STI hx: Denies any history of STIs - Sexual preference: Not sexually active - Contraceptive method: no - Fertility desires: satisfied parity - HPV vaccination: received - Abdominal surgeries: chole - GYN procedures: none   FHx: Denies FHx of ovarian, breast, uterine, cervical, and colon cancer   Meds: Current Outpatient Medications on File Prior to Visit  Medication Sig Dispense Refill   ACCU-CHEK GUIDE TEST STRIPS test strip Check blood sugar as directed once a day and for symptoms of high or low blood sugar.     albuterol (PROAIR HFA) 90 mcg/actuation inhaler Inhale  into the lungs     amLODIPine  (NORVASC ) 5 MG tablet TK 1 T PO D FOR HIGH BLOOD PRESSURE  1   blood glucose meter kit Use as instructed     levocetirizine (XYZAL) 5 MG tablet Take 5 mg by mouth     losartan -hydroCHLOROthiazide (HYZAAR) 50-12.5 mg tablet      metFORMIN (GLUCOPHAGE-XR) 500 MG XR tablet   4   montelukast (SINGULAIR) 10 mg tablet   4   omeprazole (PRILOSEC OTC) 20 MG EC tablet Take 1 tablet (20 mg total) by mouth once daily 30 tablet 4   OZEMPIC 1 mg/dose (4 mg/3 mL) pen injector OZEMPIC (1 MG/DOSE) 4 MG/3ML SOPN     triamcinolone (NASACORT) 55 mcg nasal spray NASACORT ALLERGY 24HR 55 MCG/ACT AERO     TRUEPLUS PEN NEEDLE 31 gauge x 1/4" needle      No current facility-administered medications on file prior to visit.      Allergies: Allergies  Allergen Reactions   Sulfa (Sulfonamide Antibiotics) Swelling   Tree Pollen-American Elm Shortness Of Breath   Lisinopril Itching   Metformin Other (See Comments)    Drops BG too low     SocHx: Social History   Tobacco Use   Smoking status: Never   Smokeless tobacco: Never  Substance Use Topics  Alcohol use: Never   Drug use: Never    Family: 2 children Occupation: home health   OBJECTIVE: BP (!) 156/110 (BP Location: Left forearm, Patient Position: Sitting)   Pulse 80   Ht 162.6 cm (5\' 4" )   Wt 98.9 kg (218 lb)   LMP 01/13/2023 (Approximate)   BMI 37.42 kg/m    Gen: NAD HEENT: Evan/AT Heart: Regular rate Lungs: Normal work of breathing Ext: No BLE edema   ASSESSMENT/PLAN: Emaley Gayman 38 y.o. [id number] with a hx of T2DM (A1c 6.8 on 06/01/23), HTN, obesity, hepatic steatosis, stress urinary incontinence, and elevated LFTs who presents for follow-up of AUB and pelvic pain.   #AUB- oligomenorrhea #Dysmenorrhea #Pelvic mass- likely fibroid #Pelvic pain - Hb 15.2 (05/14/23) - CT A/P w/ IV contrast 03/19/23 with nodular enlargement of uterus and 5.1 cm anterior uterine mass, likely fibroid. - Pelvic ultrasound  05/13/23: uterus 8.2x5.2x5.8cm with multiple fibroids largest 4 cm, normal ovaries - Pap 04/27/23 NILM with neg HRHPV - EMB benign on 05/13/23 - Pain is primarily uterine in nature as majority is around time of menses. Suspect fibroid is also contributing to pain.  - Patient is not interested in retrying any type of medical management and desires definitive surgical management with a hysterectomy. Patient is 100% sure she is done having children.   #Pre-op - PSH: chole - 2 SVDs - Surgical procedure: robotic assisted total laparoscopic hysterectomy, bilateral salpingectomy, and cystoscopy - Medical pre op clearance is needed- contacted PCP who gave verbal and awaiting documentation - Discussed planned procedure, risks, and benefits. Risks such as infection, bleeding, organ damage (including damage to ureters, bowel, and bladder), anesthesia complications, need to convert to laparotomy, and need for possible blood products were discussed; patient will accept products in case of an emergency. We discussed the risks of a blood transfusion, such as a 1 in 1.2-1.4 million chance of contracting HIV, Hep C.  - Surgical and blood consents signed. - Hysterectomy consent signed - Plan for ERAS protocol and same day discharge without a spinal  - Pre-op orders placed. Plan for cefazolin  and metronidazole for antibiotics - Discussed post-op lifting restrictions and pelvic rest - Post-op prescriptions (scheduled tylenol  q8h, ibuprofen q8h, oxycodone  q6h PRN x 12 tabs, senna x 7d, miralax x 7d, zofran  PRN) sent   #T2DM (A1c 6.8 on 06/01/23) #HTN #Elevated LFTs, improving #Hepatic steatosis - AST/ALT 86/191 (02/26/23) > AST/ ALT 49/95 (06/01/23) - S/p GI evaluation - Amlodipine  and HCTZ/losartan  per PCP - Metformin XR and Semaglutide per PCP   Unknown Schleyer Jimmy Moulding, MD

## 2023-06-29 ENCOUNTER — Encounter
Admission: RE | Admit: 2023-06-29 | Discharge: 2023-06-29 | Disposition: A | Discharge status: Home / Self Care | Source: Ambulatory Visit | Attending: Obstetrics and Gynecology | Admitting: Obstetrics and Gynecology

## 2023-06-29 ENCOUNTER — Other Ambulatory Visit: Payer: Self-pay

## 2023-06-29 VITALS — Ht 64.0 in | Wt 214.0 lb

## 2023-06-29 DIAGNOSIS — Z01812 Encounter for preprocedural laboratory examination: Secondary | ICD-10-CM

## 2023-06-29 HISTORY — DX: Other chest pain: R07.89

## 2023-06-29 HISTORY — DX: Fatty (change of) liver, not elsewhere classified: K76.0

## 2023-06-29 HISTORY — DX: Abnormal uterine and vaginal bleeding, unspecified: N93.9

## 2023-06-29 HISTORY — DX: Type 2 diabetes mellitus without complications: E11.9

## 2023-06-29 NOTE — Patient Instructions (Addendum)
 Your procedure is scheduled on: Wednesday, June 25 Report to the Registration Desk on the 1st floor of the [facility] To find out your arrival time, please call [phone] between 1PM - 3PM on: Tuesday, June 24 If your arrival time is 6:00 am, do not arrive before that time as the [facility] entrance doors do not open until 6:00 am.  REMEMBER: Instructions that are not followed completely may result in serious medical risk, up to and including death; or upon the discretion of your surgeon and anesthesiologist your surgery may need to be rescheduled.  Do not eat food after midnight the night before surgery.  No gum chewing or hard candies.  You may however, drink water up to 2 hours before you are scheduled to arrive for your surgery. Do not drink anything within 2 hours of your scheduled arrival time.  In addition, your doctor has ordered for you to drink the provided:  Gatorade G2 Drinking this carbohydrate drink up to two hours before surgery helps to reduce insulin resistance and improve patient outcomes. Please complete drinking 2 hours before scheduled arrival time.  One week prior to surgery: starting June 18 Stop Anti-inflammatories (NSAIDS) such as Advil, Aleve, Ibuprofen, Motrin, Naproxen, Naprosyn and Aspirin based products such as Excedrin, Goody's Powder, BC Powder. Stop ANY OVER THE COUNTER supplements until after surgery.  You may however, continue to take Tylenol  if needed for pain up until the day of surgery.  Ozempic - hold for 7 days before surgery. Do NOT take on Sunday, June 22. Resume AFTER surgery on your regular day, Sunday, June 29.  Metformin - hold for 2 days before surgery. Last day to take is Sunday, June 22. Resume AFTER surgery.  Continue taking all of your other prescription medications up until the day of surgery.  ON THE DAY OF SURGERY DO NOT TAKE ANY MEDICATIONS  Bring our proair inhaler to the hospital.  No Alcohol for 24 hours before or  after surgery.  No Smoking including e-cigarettes for 24 hours before surgery.  No chewable tobacco products for at least 6 hours before surgery.  No nicotine patches on the day of surgery.  Do not use any recreational drugs for at least a week (preferably 2 weeks) before your surgery.  Please be advised that the combination of cocaine and anesthesia may have negative outcomes, up to and including death. If you test positive for cocaine, your surgery will be cancelled.  On the morning of surgery brush your teeth with toothpaste and water, you may rinse your mouth with mouthwash if you wish. Do not swallow any toothpaste or mouthwash.  Use CHG Soap as directed on instruction sheet.  Do not wear jewelry, make-up, hairpins, clips or nail polish.  For welded (permanent) jewelry: bracelets, anklets, waist bands, etc.  Please have this removed prior to surgery.  If it is not removed, there is a chance that hospital personnel will need to cut it off on the day of surgery.  Do not wear lotions, powders, or perfumes.   Do not shave body hair from the neck down 48 hours before surgery.  Contact lenses, hearing aids and dentures may not be worn into surgery.  Do not bring valuables to the hospital. [facility] is not responsible for any missing/lost belongings or valuables.   Notify your doctor if there is any change in your medical condition (cold, fever, infection).  Wear comfortable clothing (specific to your surgery type) to the hospital.  After surgery,  you can help prevent lung complications by doing breathing exercises.  Take deep breaths and cough every 1-2 hours. Your doctor may order a device called an Incentive Spirometer to help you take deep breaths. When coughing or sneezing, hold a pillow firmly against your incision with both hands. This is called "splinting." Doing this helps protect your incision. It also decreases belly discomfort.  If you are being admitted to the  hospital overnight, leave your suitcase in the car. After surgery it may be brought to your room.  In case of increased patient census, it may be necessary for you, the patient, to continue your postoperative care in the Same Day Surgery department.  If you are being discharged the day of surgery, you will not be allowed to drive home. You will need a responsible individual to drive you home and stay with you for 24 hours after surgery.   If you are taking public transportation, you will need to have a responsible individual with you.  Please call the Pre-admissions Testing Dept. at [phone] if you have any questions about these instructions.  Surgery Visitation Policy:  Patients having surgery or a procedure may have two visitors.  Children under the age of 24 must have an adult with them who is not the patient.  Inpatient Visitation:    Visiting hours are 7 a.m. to 8 p.m. Up to four visitors are allowed at one time in a patient room. The visitors may rotate out with other people during the day.  One visitor age 15 or older may stay with the patient overnight and must be in the room by 8 p.m.     Preparing for Surgery with CHLORHEXIDINE  GLUCONATE (CHG) Soap  Chlorhexidine  Gluconate (CHG) Soap  o An antiseptic cleaner that kills germs and bonds with the skin to continue killing germs even after washing  o Used for showering the night before surgery and morning of surgery  Before surgery, you can play an important role by reducing the number of germs on your skin.  CHG (Chlorhexidine  gluconate) soap is an antiseptic cleanser which kills germs and bonds with the skin to continue killing germs even after washing.  Please do not use if you have an allergy to CHG or antibacterial soaps. If your skin becomes reddened/irritated stop using the CHG.  1. Shower the NIGHT BEFORE SURGERY and the MORNING OF SURGERY with CHG soap.  2. If you choose to wash your hair, wash your hair  first as usual with your normal shampoo.  3. After shampooing, rinse your hair and body thoroughly to remove the shampoo.  4. Use CHG as you would any other liquid soap. You can apply CHG directly to the skin and wash gently with a scrungie or a clean washcloth.  5. Apply the CHG soap to your body only from the neck down. Do not use on open wounds or open sores. Avoid contact with your eyes, ears, mouth, and genitals (private parts). Wash face and genitals (private parts) with your normal soap.  6. Wash thoroughly, paying special attention to the area where your surgery will be performed.  7. Thoroughly rinse your body with warm water.  8. Do not shower/wash with your normal soap after using and rinsing off the CHG soap.  9. Pat yourself dry with a clean towel.  10. Wear clean pajamas to bed the night before surgery.  12. Place clean sheets on your bed the night of your first shower and do not sleep  with pets.  13. Shower again with the CHG soap on the day of surgery prior to arriving at the hospital.  14. Do not apply any deodorants/lotions/powders.  15. Please wear clean clothes to the hospital.

## 2023-06-30 ENCOUNTER — Encounter
Admission: RE | Admit: 2023-06-30 | Discharge: 2023-06-30 | Disposition: A | Discharge status: Home / Self Care | Source: Ambulatory Visit | Attending: Obstetrics and Gynecology | Admitting: Obstetrics and Gynecology

## 2023-06-30 DIAGNOSIS — Z79899 Other long term (current) drug therapy: Secondary | ICD-10-CM

## 2023-06-30 DIAGNOSIS — Z01812 Encounter for preprocedural laboratory examination: Secondary | ICD-10-CM

## 2023-06-30 DIAGNOSIS — T502X5A Adverse effect of carbonic-anhydrase inhibitors, benzothiadiazides and other diuretics, initial encounter: Secondary | ICD-10-CM

## 2023-06-30 DIAGNOSIS — Z01818 Encounter for other preprocedural examination: Secondary | ICD-10-CM

## 2023-06-30 LAB — COMPREHENSIVE METABOLIC PANEL WITH GFR
ALT: 66 U/L — ABNORMAL HIGH (ref 0–44)
AST: 42 U/L — ABNORMAL HIGH (ref 15–41)
Albumin: 4.1 g/dL (ref 3.5–5.0)
Alkaline Phosphatase: 47 U/L (ref 38–126)
Anion gap: 9 (ref 5–15)
BUN: 11 mg/dL (ref 6–20)
CO2: 24 mmol/L (ref 22–32)
Calcium: 8.8 mg/dL — ABNORMAL LOW (ref 8.9–10.3)
Chloride: 102 mmol/L (ref 98–111)
Creatinine, Ser: 0.52 mg/dL (ref 0.44–1.00)
GFR, Estimated: >60 mL/min (ref >60)
Glucose, Bld: 115 mg/dL — ABNORMAL HIGH (ref 70–99)
Potassium: 3.2 mmol/L — ABNORMAL LOW (ref 3.5–5.1)
Sodium: 135 mmol/L (ref 135–145)
Total Bilirubin: 1.1 mg/dL (ref 0.0–1.2)
Total Protein: 7.7 g/dL (ref 6.5–8.1)

## 2023-06-30 LAB — CBC
HCT: 43.7 % (ref 36.0–46.0)
Hemoglobin: 15.4 g/dL — ABNORMAL HIGH (ref 12.0–15.0)
MCH: 28.7 pg (ref 26.0–34.0)
MCHC: 35.2 g/dL (ref 30.0–36.0)
MCV: 81.4 fL (ref 80.0–100.0)
Platelets: 257 10*3/uL (ref 150–400)
RBC: 5.37 MIL/uL — ABNORMAL HIGH (ref 3.87–5.11)
RDW: 12.6 % (ref 11.5–15.5)
WBC: 5.2 10*3/uL (ref 4.0–10.5)
nRBC: 0 % (ref 0.0–0.2)

## 2023-06-30 LAB — TYPE AND SCREEN
ABO/RH(D): A POS
Antibody Screen: NEGATIVE

## 2023-06-30 MED ORDER — POTASSIUM CHLORIDE ER 10 MEQ PO TBCR: 10.0000 meq | EXTENDED_RELEASE_TABLET | Freq: Every day | ORAL | 0 refills | Status: AC

## 2023-06-30 NOTE — Progress Notes (Signed)
 [facility] Perioperative Services: Pre-Admission/Anesthesia Testing  Abnormal Lab Notification and Treatment Plan of Care   Date: 06/30/23  Name: Joan Alexander MRN:   [id number]  Re: Abnormal labs noted during PAT appointment   Notified:  Provider Name Provider Role Notification Mode  Schermerhorn, Kasey Painter, MD  OB/GYN (Surgeon) Routed and/or faxed via [facility], [facility] Primary Care Provider Routed and/or faxed via [facility]   Clinical Information and Notes:  ABNORMAL LAB VALUE(S): Lab Results  Component Value Date   K 3.2 (L) 06/30/2023   Joan Alexander is scheduled for an elective HYSTERECTOMY, TOTAL, LAPAROSCOPIC, ROBOT-ASSISTED WITH SALPINGECTOMY (Bilateral: Uterus) CYSTOSCOPY (Bladder) on 07/07/2023. In review of her medication reconciliation, it is noted that the patient is taking prescribed diuretic medications (HCTZ 12.5 mg) daily.   Please note, in efforts to promote a safe and effective anesthetic course, per current guidelines/standards set by the [facility] anesthesia team, the minimal acceptable K+ level for the patient to proceed with general anesthesia is 3.0 mmol/L. With that being said, if the patient drops any lower, her elective procedure will need to be postponed until K+ is better optimized. In efforts to prevent case cancellation, and ultimately to promote the safety of this patient undergoing sedation/anesthesia, will make efforts to optimize pre-surgical K+ level allowing the surgical intervention to proceed as planned.    Impression and Plan:  Joan Alexander found to be HYPOkalemic at 3.2 mmol/L on preoperative labs.   She is on thiazide diuretic therapy. Discussed diuretic therapy as likely etiology in the absence of GI related symptoms (no diarrhea). Patient denies regular use of laxative medications. Reviewed other potential causes, including decreased intake of dietary K+ and warmer weather resulting in increased  insensible losses. Reviewed plans for preoperative optimization as follows:   Meds ordered this encounter  Medications   potassium chloride  (KLOR-CON ) 10 MEQ tablet    Sig: Take 1 tablet (10 mEq total) by mouth daily for 7 days. Be sure to take dose on the morning of surgery. Follow up with PCP for repeat labs.    Dispense:  7 tablet    Refill:  0    Please contact the patient as soon as it is available for pickup. Rx is for preoperative K+ optimization and needs to be started ASAP.   Encouraged patient to follow up with PCP about 2-3 weeks postoperatively to have labs rechecked to ensure that levels are remaining within normal range. Discussed nutritional intake of K+ rich foods as an adjunctive way to keep her K+ levels normal; list of K+ rich foods provided. Also mentioned ORS, however advised her not to rely solely on these drinks, as they are high in Na+, and she has a HTN diagnosis.   Will send copy of this note to surgeon and PCP to make them aware of K+ level and plans for correction. Discussed that PCP may elect to pursue a change in diuretic therapy to a K+ sparing type medication, or alternatively, they may consider adding a daily K+ supplement if levels remain low on recheck. Order entered to recheck K+ on the day of her surgery to ensure optimization. Wished patient the best of luck with her upcoming surgery and subsequent recovery. She was encouraged to return call to the PAT clinic, or to her surgeon's office, should any questions or concerns arise between now and the time of her surgery.   Encounter Diagnoses  Name Primary?   Pre-operative laboratory examination Yes   Diuretic-induced  hypokalemia    Long term current use of diuretic    Manju Kulkarni, MSN, APRN, FNP-C, CEN [facility]  Perioperative Services Nurse Practitioner Phone: [phone] 06/30/23 1:51 PM  NOTE: This note has been prepared using Dragon dictation software. Despite my best ability to  proofread, there is always the potential that unintentional transcriptional errors may still occur from this process.

## 2023-07-01 ENCOUNTER — Ambulatory Visit: Payer: Self-pay | Admitting: Obstetrics and Gynecology

## 2023-07-07 ENCOUNTER — Ambulatory Visit: Payer: Self-pay | Admitting: Urgent Care

## 2023-07-07 ENCOUNTER — Encounter: Payer: Self-pay | Admitting: Obstetrics and Gynecology

## 2023-07-07 ENCOUNTER — Other Ambulatory Visit: Payer: Self-pay

## 2023-07-07 ENCOUNTER — Ambulatory Visit
Admission: RE | Admit: 2023-07-07 | Discharge: 2023-07-07 | Disposition: A | Discharge status: Home / Self Care | Attending: Obstetrics and Gynecology | Admitting: Obstetrics and Gynecology

## 2023-07-07 ENCOUNTER — Encounter
Admission: RE | Disposition: A | Discharge status: Home / Self Care | Payer: Self-pay | Source: Home / Self Care | Attending: Obstetrics and Gynecology

## 2023-07-07 DIAGNOSIS — D219 Benign neoplasm of connective and other soft tissue, unspecified: Secondary | ICD-10-CM | POA: Diagnosis present

## 2023-07-07 DIAGNOSIS — R102 Pelvic and perineal pain: Secondary | ICD-10-CM | POA: Diagnosis present

## 2023-07-07 DIAGNOSIS — Z01812 Encounter for preprocedural laboratory examination: Secondary | ICD-10-CM

## 2023-07-07 DIAGNOSIS — N939 Abnormal uterine and vaginal bleeding, unspecified: Secondary | ICD-10-CM | POA: Insufficient documentation

## 2023-07-07 DIAGNOSIS — N838 Other noninflammatory disorders of ovary, fallopian tube and broad ligament: Secondary | ICD-10-CM | POA: Diagnosis not present

## 2023-07-07 DIAGNOSIS — Z01818 Encounter for other preprocedural examination: Secondary | ICD-10-CM

## 2023-07-07 DIAGNOSIS — T502X5A Adverse effect of carbonic-anhydrase inhibitors, benzothiadiazides and other diuretics, initial encounter: Secondary | ICD-10-CM

## 2023-07-07 DIAGNOSIS — Z79899 Other long term (current) drug therapy: Secondary | ICD-10-CM

## 2023-07-07 DIAGNOSIS — D251 Intramural leiomyoma of uterus: Secondary | ICD-10-CM | POA: Insufficient documentation

## 2023-07-07 DIAGNOSIS — E119 Type 2 diabetes mellitus without complications: Secondary | ICD-10-CM | POA: Insufficient documentation

## 2023-07-07 DIAGNOSIS — I10 Essential (primary) hypertension: Secondary | ICD-10-CM | POA: Diagnosis not present

## 2023-07-07 HISTORY — PX: CYSTOSCOPY: SHX5120

## 2023-07-07 HISTORY — PX: HYSTERECTOMY, TOTAL, LAPAROSCOPIC, ROBOT-ASSISTED WITH SALPINGECTOMY: SHX7587

## 2023-07-07 LAB — POCT PREGNANCY, URINE: Preg Test, Ur: NEGATIVE

## 2023-07-07 LAB — POCT I-STAT, CHEM 8
BUN: 8 mg/dL (ref 6–20)
Calcium, Ion: 1.14 mmol/L — ABNORMAL LOW (ref 1.15–1.40)
Chloride: 101 mmol/L (ref 98–111)
Creatinine, Ser: 0.5 mg/dL (ref 0.44–1.00)
Glucose, Bld: 109 mg/dL — ABNORMAL HIGH (ref 70–99)
HCT: 44 % (ref 36.0–46.0)
Hemoglobin: 15 g/dL (ref 12.0–15.0)
Potassium: 3.4 mmol/L — ABNORMAL LOW (ref 3.5–5.1)
Sodium: 139 mmol/L (ref 135–145)
TCO2: 25 mmol/L (ref 22–32)

## 2023-07-07 LAB — ABO/RH: ABO/RH(D): A POS

## 2023-07-07 LAB — GLUCOSE, CAPILLARY: Glucose-Capillary: 217 mg/dL — ABNORMAL HIGH (ref 70–99)

## 2023-07-07 SURGERY — HYSTERECTOMY, TOTAL, LAPAROSCOPIC, ROBOT-ASSISTED WITH SALPINGECTOMY
Anesthesia: General | Site: Uterus

## 2023-07-07 MED ORDER — PHENYLEPHRINE 80 MCG/ML (10ML) SYRINGE FOR IV PUSH (FOR BLOOD PRESSURE SUPPORT)
PREFILLED_SYRINGE | INTRAVENOUS | Status: DC | PRN
  Administered 2023-07-07 (×2): 160 ug via INTRAVENOUS

## 2023-07-07 MED ORDER — SUGAMMADEX SODIUM 200 MG/2ML IV SOLN
INTRAVENOUS | Status: DC | PRN
  Administered 2023-07-07: 200 mg via INTRAVENOUS

## 2023-07-07 MED ORDER — DEXAMETHASONE SODIUM PHOSPHATE 10 MG/ML IJ SOLN
INTRAMUSCULAR | Status: DC | PRN
  Administered 2023-07-07: 5 mg via INTRAVENOUS

## 2023-07-07 MED ORDER — KETOROLAC TROMETHAMINE 30 MG/ML IJ SOLN
30.0000 mg | Freq: Once | INTRAMUSCULAR | Status: AC
  Administered 2023-07-07: 30 mg via INTRAVENOUS

## 2023-07-07 MED ORDER — FENTANYL CITRATE (PF) 100 MCG/2ML IJ SOLN
INTRAMUSCULAR | Status: AC
  Filled 2023-07-07: qty 2

## 2023-07-07 MED ORDER — LACTATED RINGERS IV SOLN: INTRAVENOUS | Status: DC | PRN

## 2023-07-07 MED ORDER — FENTANYL CITRATE (PF) 100 MCG/2ML IJ SOLN
INTRAMUSCULAR | Status: DC | PRN
  Administered 2023-07-07: 50 ug via INTRAVENOUS
  Administered 2023-07-07: 25 ug via INTRAVENOUS
  Administered 2023-07-07 (×2): 50 ug via INTRAVENOUS
  Administered 2023-07-07: 25 ug via INTRAVENOUS

## 2023-07-07 MED ORDER — ORAL CARE MOUTH RINSE
15.0000 mL | Freq: Once | OROMUCOSAL | Status: AC
Start: 2023-07-07 — End: 2023-07-07

## 2023-07-07 MED ORDER — ACETAMINOPHEN 500 MG PO TABS
1000.0000 mg | ORAL_TABLET | ORAL | Status: AC
  Administered 2023-07-07: 1000 mg via ORAL

## 2023-07-07 MED ORDER — ACETAMINOPHEN 500 MG PO TABS
ORAL_TABLET | ORAL | Status: AC
Start: 2023-07-07 — End: 2023-07-07
  Filled 2023-07-07: qty 2

## 2023-07-07 MED ORDER — GABAPENTIN 300 MG PO CAPS
300.0000 mg | ORAL_CAPSULE | ORAL | Status: AC
  Administered 2023-07-07: 300 mg via ORAL

## 2023-07-07 MED ORDER — EPHEDRINE SULFATE-NACL 50-0.9 MG/10ML-% IV SOSY
PREFILLED_SYRINGE | INTRAVENOUS | Status: DC | PRN
  Administered 2023-07-07: 10 mg via INTRAVENOUS

## 2023-07-07 MED ORDER — ROCURONIUM BROMIDE 100 MG/10ML IV SOLN
INTRAVENOUS | Status: DC | PRN
  Administered 2023-07-07: 20 mg via INTRAVENOUS
  Administered 2023-07-07 (×2): 50 mg via INTRAVENOUS

## 2023-07-07 MED ORDER — PROPOFOL 10 MG/ML IV BOLUS
INTRAVENOUS | Status: DC | PRN
  Administered 2023-07-07: 150 mg via INTRAVENOUS

## 2023-07-07 MED ORDER — SODIUM CHLORIDE 0.9 % IV SOLN: INTRAVENOUS | Status: DC

## 2023-07-07 MED ORDER — DEXAMETHASONE SODIUM PHOSPHATE 10 MG/ML IJ SOLN
INTRAMUSCULAR | Status: AC
  Filled 2023-07-07: qty 1

## 2023-07-07 MED ORDER — DROPERIDOL 2.5 MG/ML IJ SOLN
0.6250 mg | Freq: Once | INTRAMUSCULAR | Status: DC | PRN
Start: 2023-07-07 — End: 2023-07-07

## 2023-07-07 MED ORDER — METRONIDAZOLE 500 MG/100ML IV SOLN
500.0000 mg | Freq: Once | INTRAVENOUS | Status: AC
  Administered 2023-07-07: 500 mg via INTRAVENOUS
  Filled 2023-07-07: qty 100

## 2023-07-07 MED ORDER — SODIUM CHLORIDE 0.9 % IR SOLN
Status: DC | PRN
  Administered 2023-07-07: 1000 mL

## 2023-07-07 MED ORDER — SILVER NITRATE-POT NITRATE 75-25 % EX MISC
CUTANEOUS | Status: AC
  Filled 2023-07-07: qty 10

## 2023-07-07 MED ORDER — CEFAZOLIN SODIUM-DEXTROSE 2-4 GM/100ML-% IV SOLN
INTRAVENOUS | Status: AC
  Filled 2023-07-07: qty 100

## 2023-07-07 MED ORDER — ROCURONIUM BROMIDE 10 MG/ML (PF) SYRINGE
PREFILLED_SYRINGE | INTRAVENOUS | Status: AC
  Filled 2023-07-07: qty 10

## 2023-07-07 MED ORDER — CHLORHEXIDINE GLUCONATE 0.12 % MT SOLN
15.0000 mL | Freq: Once | OROMUCOSAL | Status: AC
  Administered 2023-07-07: 15 mL via OROMUCOSAL

## 2023-07-07 MED ORDER — ONDANSETRON HCL 4 MG/2ML IJ SOLN
INTRAMUSCULAR | Status: DC | PRN
  Administered 2023-07-07: 4 mg via INTRAVENOUS

## 2023-07-07 MED ORDER — KETOROLAC TROMETHAMINE 30 MG/ML IJ SOLN
INTRAMUSCULAR | Status: AC
Start: 2023-07-07 — End: 2023-07-07
  Filled 2023-07-07: qty 1

## 2023-07-07 MED ORDER — POVIDONE-IODINE 10 % EX SWAB
2.0000 | Freq: Once | CUTANEOUS | Status: AC
Start: 2023-07-07 — End: 2023-07-07
  Administered 2023-07-07: 2 via TOPICAL

## 2023-07-07 MED ORDER — MIDAZOLAM HCL 2 MG/2ML IJ SOLN
INTRAMUSCULAR | Status: DC | PRN
  Administered 2023-07-07: 2 mg via INTRAVENOUS

## 2023-07-07 MED ORDER — PROPOFOL 10 MG/ML IV BOLUS
INTRAVENOUS | Status: AC
  Filled 2023-07-07: qty 20

## 2023-07-07 MED ORDER — FENTANYL CITRATE (PF) 100 MCG/2ML IJ SOLN: 25.0000 ug | INTRAMUSCULAR | Status: DC | PRN

## 2023-07-07 MED ORDER — BUPIVACAINE HCL (PF) 0.5 % IJ SOLN
INTRAMUSCULAR | Status: AC
  Filled 2023-07-07: qty 30

## 2023-07-07 MED ORDER — 0.9 % SODIUM CHLORIDE (POUR BTL) OPTIME
TOPICAL | Status: DC | PRN
  Administered 2023-07-07: 500 mL

## 2023-07-07 MED ORDER — CEFAZOLIN SODIUM-DEXTROSE 2-4 GM/100ML-% IV SOLN
2.0000 g | INTRAVENOUS | Status: AC
  Administered 2023-07-07: 2 g via INTRAVENOUS

## 2023-07-07 MED ORDER — GABAPENTIN 300 MG PO CAPS
ORAL_CAPSULE | ORAL | Status: AC
  Filled 2023-07-07: qty 1

## 2023-07-07 MED ORDER — ONDANSETRON HCL 4 MG/2ML IJ SOLN
INTRAMUSCULAR | Status: AC
  Filled 2023-07-07: qty 2

## 2023-07-07 MED ORDER — MIDAZOLAM HCL 2 MG/2ML IJ SOLN
INTRAMUSCULAR | Status: AC
  Filled 2023-07-07: qty 2

## 2023-07-07 MED ORDER — LIDOCAINE HCL (CARDIAC) PF 100 MG/5ML IV SOSY
PREFILLED_SYRINGE | INTRAVENOUS | Status: DC | PRN
  Administered 2023-07-07: 60 mg via INTRAVENOUS

## 2023-07-07 MED ORDER — CHLORHEXIDINE GLUCONATE 0.12 % MT SOLN
OROMUCOSAL | Status: AC
  Filled 2023-07-07: qty 15

## 2023-07-07 MED ORDER — BUPIVACAINE HCL (PF) 0.5 % IJ SOLN
INTRAMUSCULAR | Status: DC | PRN
  Administered 2023-07-07: 25 mL

## 2023-07-07 SURGICAL SUPPLY — 55 items
BAG URINE DRAIN 2000ML AR STRL (UROLOGICAL SUPPLIES) ×2 IMPLANT
BENZOIN TINCTURE PRP APPL 2/3 (GAUZE/BANDAGES/DRESSINGS) ×2 IMPLANT
BLADE SURG SZ11 CARB STEEL (BLADE) ×2 IMPLANT
CANNULA CAP OBTURATR AIRSEAL 8 (CAP) ×2 IMPLANT
CATH URTH 16FR FL 2W BLN LF (CATHETERS) ×2 IMPLANT
COUNTER NDL MAGNETIC 40 RED (SET/KITS/TRAYS/PACK) ×2 IMPLANT
COUNTER NEEDLE MAGNETIC 40 RED (SET/KITS/TRAYS/PACK) ×2 IMPLANT
COVER TIP SHEARS 8 DVNC (MISCELLANEOUS) ×2 IMPLANT
DRAPE ARM DVNC X/XI (DISPOSABLE) ×8 IMPLANT
DRAPE COLUMN DVNC XI (DISPOSABLE) ×2 IMPLANT
DRAPE SHEET LG 3/4 BI-LAMINATE (DRAPES) ×2 IMPLANT
DRIVER NDL MEGA 8 DVNC XI (INSTRUMENTS) ×2 IMPLANT
DRIVER NDL MEGA SUTCUT DVNCXI (INSTRUMENTS) ×2 IMPLANT
DRIVER NDLE MEGA DVNC XI (INSTRUMENTS) ×2 IMPLANT
DRIVER NDLE MEGA SUTCUT DVNCXI (INSTRUMENTS) ×2 IMPLANT
DRSG TEGADERM 2-3/8X2-3/4 SM (GAUZE/BANDAGES/DRESSINGS) ×8 IMPLANT
ELECTRODE REM PT RTRN 9FT ADLT (ELECTROSURGICAL) ×2 IMPLANT
FORCEPS BPLR FENES DVNC XI (FORCEP) ×2 IMPLANT
GAUZE 4X4 16PLY ~~LOC~~+RFID DBL (SPONGE) ×2 IMPLANT
GAUZE SPONGE 2X2 8PLY STRL LF (GAUZE/BANDAGES/DRESSINGS) IMPLANT
GAUZE SPONGE 2X2 STRL 8-PLY (GAUZE/BANDAGES/DRESSINGS) ×4 IMPLANT
GLOVE SURG SYN 6.5 ES PF (GLOVE) ×8 IMPLANT
GLOVE SURG SYN 6.5 PF PI (GLOVE) ×8 IMPLANT
GOWN STRL REUS W/ TWL LRG LVL3 (GOWN DISPOSABLE) ×8 IMPLANT
IRRIGATION STRYKERFLOW (MISCELLANEOUS) IMPLANT
IRRIGATOR SUCT 8 DISP DVNC XI (IRRIGATION / IRRIGATOR) IMPLANT
IV NS 1000ML BAXH (IV SOLUTION) IMPLANT
KIT PINK PAD W/HEAD ARM REST (MISCELLANEOUS) ×2 IMPLANT
LABEL OR SOLS (LABEL) ×2 IMPLANT
MANIFOLD NEPTUNE II (INSTRUMENTS) ×2 IMPLANT
MANIPULATOR VCARE LG CRV RETR (MISCELLANEOUS) IMPLANT
MANIPULATOR VCARE SML CRV RETR (MISCELLANEOUS) IMPLANT
MANIPULATOR VCARE STD CRV RETR (MISCELLANEOUS) IMPLANT
NS IRRIG 1000ML POUR BTL (IV SOLUTION) ×2 IMPLANT
NS IRRIG 500ML POUR BTL (IV SOLUTION) IMPLANT
OBTURATOR OPTICAL LONG 8 DVNC (TROCAR) IMPLANT
OBTURATOR OPTICALSTD 8 DVNC (TROCAR) ×2 IMPLANT
OCCLUDER COLPOPNEUMO (BALLOONS) ×2 IMPLANT
PACK GYN LAPAROSCOPIC (MISCELLANEOUS) ×2 IMPLANT
PAD OB MATERNITY 11 LF (PERSONAL CARE ITEMS) ×2 IMPLANT
PAD PREP OB/GYN DISP 24X41 (PERSONAL CARE ITEMS) ×2 IMPLANT
POWDER SURGICEL 3.0 GRAM (HEMOSTASIS) IMPLANT
SCRUB CHG 4% DYNA-HEX 4OZ (MISCELLANEOUS) ×2 IMPLANT
SEAL UNIV 5-12 XI (MISCELLANEOUS) ×6 IMPLANT
SEALER VESSEL EXT DVNC XI (MISCELLANEOUS) ×2 IMPLANT
SET CYSTO W/LG BORE CLAMP LF (SET/KITS/TRAYS/PACK) IMPLANT
SET TUBE FILTERED XL AIRSEAL (SET/KITS/TRAYS/PACK) ×2 IMPLANT
SOLUTION ELECTROSURG ANTI STCK (MISCELLANEOUS) ×2 IMPLANT
STRIP CLOSURE SKIN 1/4X4 (GAUZE/BANDAGES/DRESSINGS) ×2 IMPLANT
SURGILUBE 2OZ TUBE FLIPTOP (MISCELLANEOUS) ×2 IMPLANT
SUT STRATAFIX SPIRAL PDS+ 0 30 (SUTURE) ×2 IMPLANT
SUTURE MNCRL 4-0 27XMF (SUTURE) ×2 IMPLANT
SYR 50ML LL SCALE MARK (SYRINGE) ×2 IMPLANT
TIP ENDOSCOPIC SURGICEL (TIP) IMPLANT
WATER STERILE IRR 500ML POUR (IV SOLUTION) ×2 IMPLANT

## 2023-07-07 NOTE — Anesthesia Procedure Notes (Signed)
 Procedure Name: Intubation Date/Time: 07/07/2023 7:39 AM  Performed by: Niki Manus SAUNDERS, CRNAPre-anesthesia Checklist: Patient identified, Patient being monitored, Timeout performed, Emergency Drugs available and Suction available Patient Re-evaluated:Patient Re-evaluated prior to induction Oxygen Delivery Method: Circle system utilized Preoxygenation: Pre-oxygenation with 100% oxygen Induction Type: IV induction Ventilation: Mask ventilation without difficulty Laryngoscope Size: McGrath and 4 Grade View: Grade I Tube type: Oral Tube size: 7.0 mm Number of attempts: 1 Airway Equipment and Method: Stylet Placement Confirmation: ETT inserted through vocal cords under direct vision, positive ETCO2 and breath sounds checked- equal and bilateral Secured at: 21 cm Tube secured with: Tape Dental Injury: Teeth and Oropharynx as per pre-operative assessment

## 2023-07-07 NOTE — Transfer of Care (Signed)
 Immediate Anesthesia Transfer of Care Note  Patient: Joan Alexander  Procedure(s) Performed: HYSTERECTOMY, TOTAL, LAPAROSCOPIC, ROBOT-ASSISTED WITH SALPINGECTOMY (Bilateral: Uterus) CYSTOSCOPY (Bladder)  Patient Location: PACU  Anesthesia Type:General  Level of Consciousness: drowsy  Airway & Oxygen Therapy: Patient Spontanous Breathing and Patient connected to face mask oxygen  Post-op Assessment: Report given to RN and Post -op Vital signs reviewed and stable  Post vital signs: Reviewed and stable  Last Vitals:  Vitals Value Taken Time  BP 116/68 07/07/23 10:10  Temp 36.7 C 07/07/23 10:10  Pulse 89 07/07/23 10:12  Resp 15 07/07/23 10:12  SpO2 98 % 07/07/23 10:12  Vitals shown include unfiled device data.  Last Pain:  Vitals:   07/07/23 0631  TempSrc: Temporal  PainSc: 0-No pain         Complications: No notable events documented.

## 2023-07-07 NOTE — Op Note (Signed)
 OPERATIVE NOTE   DATE OF SERVICE:  07/07/2023 NAME: Joan Alexander MRN: [id number]   PREOPERATIVE DIAGNOSES: Abnormal uterine bleeding, fibroids, pelvic pain   POSTOPERATIVE DIAGNOSES: Same   PROCEDURE: Robot assisted total laparoscopic hysterectomy, bilateral salpingectomy, and cystoscopy    ANESTHESIA: GETA   SURGEON:  Laketra Bowdish, MD ASSISTANT: Heather Penton, MD   Findings: 9 cm uterus with multiple intramural fibroids and one 7 cm left lateral fibroid in the left broad ligament with a atrophic round ligament overlying the fibroid. Normal appearing bilateral fallopian tubes and ovaries. No intra-abdominal adhesions other than some adhesions over the liver to the anterior abdominal wall. Omentum, peritoneum, and bowel normal to the extent seen. Left hepatic lobe noted to be enlarged and fibrotic (mild). Right liver lobe normal appearing. Surgically absent gallbladder. Cystoscopy was normal with brisk bilateral efflux from ureteral jets and no obvious defects, suture, erythema or other abnormalities in the bladder mucosa.   Estimated blood loss in the OR: 20cc   Fluids: 1000cc   UOP: 600cc   COMPLICATIONS:None.   SPECIMENS:Fibroid uterus, cervix, and bilateral fallopian tubes   DESCRIPTION OF PROCEDURE: Patient was taken to the OR, where general anesthesia was induced. Patient was placed in a dorsal lithotomy position.  Abdomen and vagina were prepped and draped in the usual sterile fashion.  A transurethral Foley catheter was placed. A speculum was placed and the cervix visualized. A tenaculum was placed on the cervix and the cervix was dilated to accommodate a Orthoptist. This was placed on the cervix and all other vaginal instruments were removed without difficulty. Surgeon's gloves were changed and attention was turned to the abdomen.    First, 5cc of 0.5% Marcaine  was injected at the intended incision site midline approximately 6 cm cephalad to the umbilicus (due  to the umbilicus being displaced inferiorly compared to normal anatomic location). An 8 mm incision was made and using direct visualization the peritoneal cavity was entered. The cavity was insufflated with CO2 gas and the laparoscope reinserted confirming abdominal entry.  The pressure was set at 15 mm Hg. Another 5cc of 0.5% Marcaine  was injected at the intended incision site 10 cm lateral on the left from the midline port. An 8mm left incision was made and robot port was placed. The same was performed approximately 10cm laterally on the right. The patient was placed in 26 degrees of Trendelenburg and the peritoneal contents were inspected with the above-noted findings. The robot was docked with monopolar scissors in the right hand and a bipolar foreceps in the left hand.    The surgeon went to the surgeon console where the surgery was continued. Bilateral ureters were visualized and noted to be far below intended incision sites. The left uteroovarian ligament was then cauterized and cut.  The left fallopian tube was transected along the mesosalpinx from the fimbriae to the cornua. It was transected at the cornua and the specimen was removed. The left round ligament overlying the fibroid in the broad ligament was then ligated and transected. The fibroid in the left broad ligament was dissected away from the pelvic side wall and the left ovary. The broad ligament was dissected down to the level of the internal os.  The left uterine artery was skeletonized, cauterized, and cut. A bladder flap was then created and the bladder bluntly pushed down until safely away from the cervix.   Attention turned to the right side. The right fallopian tube was transected along the mesosalpinx from the fimbriae to the  cornua. It was transected at the cornua and the specimen was removed.The right uteroovarian ligament was cauterized and cut. The right round ligament was then ligated and transected. The broad ligament was then  dissected down to the level of the internal os.  The right uterine artery was skeletonized, cauterized, and cut.   The colpotomy incision was started anteriorly and was taken around the complete circumference until the specimen was completely detached and taken out through the vagina.  The abdomen was suctioned and irrigated. Hemostasis noted.    The vaginal cuff was closed using a running 0 Stratafix barbed suture careful to incorporate the peritoneum; two layer closure was used. The pelvis was irrigated. Hemostasis was noted. The robotic instruments were removed.  The robot was undocked. The abdomen was deflated and trocars were removed using a Q tip. The skin at all 3 port sites was closed with subcuticular 4-0 Monocryl in a running fashion. The incisions were covered with 2x2 and Tegaderm dressings.   The foley catheter was removed. Transurethral bladder cystoscopy was performed. Brisk efflux from bilateral UOs noted.  No injuries were noted to the bladder.     The vagina was inspected to ensure no vaginal lacerations present. The cuff was noted to be intact and hemostatic. All instruments were removed.    The patient tolerated the procedure well.  All counts were correct. General anesthesia was reversed and patient was extubated. Patient was in stable condition at time of MD leaving OR.    Beverli LULLA Dinsmore, MD, 07/07/2023, 10:03 AM

## 2023-07-07 NOTE — Anesthesia Preprocedure Evaluation (Signed)
 Anesthesia Evaluation  Patient identified by MRN, date of birth, ID band Patient awake    Reviewed: Allergy & Precautions, H&P , NPO status , Patient's Chart, lab work & pertinent test results, reviewed documented beta blocker date and time   History of Anesthesia Complications Negative for: history of anesthetic complications  Airway Mallampati: II  TM Distance: >3 FB Neck ROM: full    Dental  (+) Dental Advidsory Given   Pulmonary neg pulmonary ROS   Pulmonary exam normal        Cardiovascular Exercise Tolerance: Good hypertension, (-) angina (-) CAD, (-) Past MI, (-) Cardiac Stents and (-) CABG Normal cardiovascular exam(-) dysrhythmias (-) Valvular Problems/Murmurs     Neuro/Psych negative neurological ROS  negative psych ROS   GI/Hepatic negative GI ROS, Neg liver ROS,,,  Endo/Other  diabetes    Renal/GU negative Renal ROS  negative genitourinary   Musculoskeletal   Abdominal   Peds  Hematology negative hematology ROS (+)   Anesthesia Other Findings Past Medical History: No date: Diabetes mellitus without complication (HCC) No date: Gallstones No date: Hypertension   Reproductive/Obstetrics negative OB ROS                             Anesthesia Physical Anesthesia Plan  ASA: 2  Anesthesia Plan: General   Post-op Pain Management:    Induction: Intravenous  PONV Risk Score and Plan: 3 and Ondansetron , Dexamethasone , Treatment may vary due to age or medical condition and Midazolam   Airway Management Planned: Oral ETT  Additional Equipment:   Intra-op Plan:   Post-operative Plan: Extubation in OR  Informed Consent: I have reviewed the patients History and Physical, chart, labs and discussed the procedure including the risks, benefits and alternatives for the proposed anesthesia with the patient or authorized representative who has indicated his/her understanding and  acceptance.     Dental Advisory Given  Plan Discussed with: Anesthesiologist, CRNA and Surgeon  Anesthesia Plan Comments:         Anesthesia Quick Evaluation

## 2023-07-07 NOTE — Interval H&P Note (Signed)
 History and Physical Interval Note:  07/07/2023 7:27 AM  Joan Alexander  has presented today for surgery, with the diagnosis of AUB Fibroid Pelvic Pain.  The various methods of treatment have been discussed with the patient and family. After consideration of risks, benefits and other options for treatment, the patient has consented to  Procedure(s): HYSTERECTOMY, TOTAL, LAPAROSCOPIC, ROBOT-ASSISTED WITH SALPINGECTOMY (Bilateral) CYSTOSCOPY (N/A) as a surgical intervention.  The patient's history has been reviewed, patient examined, no change in status, stable for surgery.  I have reviewed the patient's chart and labs.  Questions were answered to the patient's satisfaction.     Darnita Woodrum V Tishawna Larouche

## 2023-07-08 ENCOUNTER — Encounter: Payer: Self-pay | Admitting: Obstetrics and Gynecology

## 2023-07-08 LAB — SURGICAL PATHOLOGY

## 2023-07-09 NOTE — Anesthesia Postprocedure Evaluation (Signed)
 Anesthesia Post Note  Patient: Joan Alexander  Procedure(s) Performed: HYSTERECTOMY, TOTAL, LAPAROSCOPIC, ROBOT-ASSISTED WITH SALPINGECTOMY (Bilateral: Uterus) CYSTOSCOPY (Bladder)  Patient location during evaluation: PACU Anesthesia Type: General Level of consciousness: awake and alert Pain management: pain level controlled Vital Signs Assessment: post-procedure vital signs reviewed and stable Respiratory status: spontaneous breathing, nonlabored ventilation, respiratory function stable and patient connected to nasal cannula oxygen Cardiovascular status: blood pressure returned to baseline and stable Postop Assessment: no apparent nausea or vomiting Anesthetic complications: no   No notable events documented.   Last Vitals:  Vitals:   07/07/23 1057 07/07/23 1236  BP: 116/85 (!) 121/96  Pulse: 76   Resp: 16   Temp: (!) 36.1 C   SpO2: 99%     Last Pain:  Vitals:   07/08/23 1041  TempSrc:   PainSc: 0-No pain                 Prentice Murphy

## 2023-07-14 ENCOUNTER — Ambulatory Visit: Payer: Self-pay | Admitting: Obstetrics and Gynecology

## 2023-10-17 ENCOUNTER — Other Ambulatory Visit: Payer: Self-pay

## 2023-10-17 ENCOUNTER — Emergency Department
Admission: EM | Admit: 2023-10-17 | Discharge: 2023-10-17 | Disposition: A | Discharge status: Home / Self Care | Attending: Emergency Medicine | Admitting: Emergency Medicine

## 2023-10-17 DIAGNOSIS — R109 Unspecified abdominal pain: Secondary | ICD-10-CM | POA: Diagnosis present

## 2023-10-17 DIAGNOSIS — K529 Noninfective gastroenteritis and colitis, unspecified: Secondary | ICD-10-CM | POA: Diagnosis not present

## 2023-10-17 LAB — COMPREHENSIVE METABOLIC PANEL WITH GFR
ALT: 31 U/L (ref 0–44)
AST: 25 U/L (ref 15–41)
Albumin: 3.7 g/dL (ref 3.5–5.0)
Alkaline Phosphatase: 42 U/L (ref 38–126)
Anion gap: 12 (ref 5–15)
BUN: 7 mg/dL (ref 6–20)
CO2: 22 mmol/L (ref 22–32)
Calcium: 8.7 mg/dL — ABNORMAL LOW (ref 8.9–10.3)
Chloride: 104 mmol/L (ref 98–111)
Creatinine, Ser: 0.52 mg/dL (ref 0.44–1.00)
GFR, Estimated: >60 mL/min (ref >60)
Glucose, Bld: 99 mg/dL (ref 70–99)
Potassium: 3.3 mmol/L — ABNORMAL LOW (ref 3.5–5.1)
Sodium: 138 mmol/L (ref 135–145)
Total Bilirubin: 0.8 mg/dL (ref 0.0–1.2)
Total Protein: 7.3 g/dL (ref 6.5–8.1)

## 2023-10-17 LAB — URINALYSIS, ROUTINE W REFLEX MICROSCOPIC
Bilirubin Urine: NEGATIVE
Glucose, UA: NEGATIVE mg/dL
Hgb urine dipstick: NEGATIVE
Ketones, ur: NEGATIVE mg/dL
Nitrite: NEGATIVE
Protein, ur: NEGATIVE mg/dL
Specific Gravity, Urine: 1.016 (ref 1.005–1.030)
pH: 5 (ref 5.0–8.0)

## 2023-10-17 LAB — LIPASE, BLOOD: Lipase: 54 U/L — ABNORMAL HIGH (ref 11–51)

## 2023-10-17 LAB — CBC
HCT: 40.1 % (ref 36.0–46.0)
Hemoglobin: 13.6 g/dL (ref 12.0–15.0)
MCH: 27.8 pg (ref 26.0–34.0)
MCHC: 33.9 g/dL (ref 30.0–36.0)
MCV: 82 fL (ref 80.0–100.0)
Platelets: 263 K/uL (ref 150–400)
RBC: 4.89 MIL/uL (ref 3.87–5.11)
RDW: 12.9 % (ref 11.5–15.5)
WBC: 7.5 K/uL (ref 4.0–10.5)
nRBC: 0 % (ref 0.0–0.2)

## 2023-10-17 LAB — TROPONIN I (HIGH SENSITIVITY): Troponin I (High Sensitivity): 3 ng/L (ref <18)

## 2023-10-17 MED ORDER — DICYCLOMINE HCL 10 MG PO CAPS: 10.0000 mg | ORAL_CAPSULE | Freq: Three times a day (TID) | ORAL | 0 refills | Status: AC

## 2023-10-17 MED ORDER — DICYCLOMINE HCL 10 MG/ML IM SOLN
20.0000 mg | Freq: Once | INTRAMUSCULAR | Status: AC
  Administered 2023-10-17: 20 mg via INTRAMUSCULAR
  Filled 2023-10-17: qty 2

## 2023-10-17 NOTE — ED Provider Notes (Signed)
 [facility] Provider Note    Event Date/Time   First MD Initiated Contact with Patient 10/17/23 1804     (approximate)   History   Abdominal Pain   HPI  Joan Alexander is a 38 y.o. female with a history of cholecystectomy, hysterectomy who presents with complaints of left-sided abdominal cramping discomfort over the last day.  She reports loose stools/diarrhea that started yesterday.  No vomiting.  No fevers     Physical Exam   Triage Vital Signs: ED Triage Vitals  Encounter Vitals Group     BP 10/17/23 1705 (!) 173/109     Girls Systolic BP Percentile --      Girls Diastolic BP Percentile --      Boys Systolic BP Percentile --      Boys Diastolic BP Percentile --      Pulse Rate 10/17/23 1705 77     Resp 10/17/23 1705 20     Temp 10/17/23 1705 98.2 F (36.8 C)     Temp Source 10/17/23 1705 Oral     SpO2 10/17/23 1705 99 %     Weight 10/17/23 1706 95.3 kg (210 lb)     Height 10/17/23 1706 1.626 m (5' 4)     Head Circumference --      Peak Flow --      Pain Score 10/17/23 1706 10     Pain Loc --      Pain Education --      Exclude from Growth Chart --     Most recent vital signs: Vitals:   10/17/23 1705  BP: (!) 173/109  Pulse: 77  Resp: 20  Temp: 98.2 F (36.8 C)  SpO2: 99%     General: Awake, no distress.  CV:  Good peripheral perfusion.  Resp:  Normal effort.  Abd:  No distention.  Soft, nontender, reassuring exam, no CVA tenderness Other:     ED Results / Procedures / Treatments   Labs (all labs ordered are listed, but only abnormal results are displayed) Labs Reviewed  COMPREHENSIVE METABOLIC PANEL WITH GFR - Abnormal; Notable for the following components:      Result Value   Potassium 3.3 (*)    Calcium 8.7 (*)    All other components within normal limits  URINALYSIS, ROUTINE W REFLEX MICROSCOPIC - Abnormal; Notable for the following components:   Color, Urine YELLOW (*)    APPearance CLOUDY (*)     Leukocytes,Ua MODERATE (*)    Bacteria, UA MANY (*)    All other components within normal limits  CBC  LIPASE, BLOOD  TROPONIN I (HIGH SENSITIVITY)     EKG  ED ECG REPORT I, Lamar Price, the attending physician, personally viewed and interpreted this ECG.  Date: 10/17/2023  Rhythm: normal sinus rhythm QRS Axis: Left axis deviation Intervals: normal ST/T Wave abnormalities: normal Narrative Interpretation: no evidence of acute ischemia    RADIOLOGY     PROCEDURES:  Critical Care performed:   Procedures   MEDICATIONS ORDERED IN ED: Medications  dicyclomine (BENTYL) injection 20 mg (20 mg Intramuscular Given 10/17/23 1832)     IMPRESSION / MDM / ASSESSMENT AND PLAN / ED COURSE  I reviewed the triage vital signs and the nursing notes. Patient's presentation is most consistent with acute illness / injury with system symptoms.  Patient presents with abdominal discomfort on the left in the setting of loose stools.  Suspicious for colitis versus early gastroenteritis especially given reassuring exam.  Lab  work reviewed and is unremarkable.  Not consistent with UTI.  Not consistent with ureterolithiasis.  No tenderness on exam to suggest diverticulitis.  Will treat with IM Bentyl here, trial Bentyl as an outpatient, return precautions discussed, patient agrees with this plan.        FINAL CLINICAL IMPRESSION(S) / ED DIAGNOSES   Final diagnoses:  Colitis     Rx / DC Orders   ED Discharge Orders          Ordered    dicyclomine (BENTYL) 10 MG capsule  3 times daily before meals & bedtime        10/17/23 1825             Note:  This document was prepared using Dragon voice recognition software and may include unintentional dictation errors.   Arlander Charleston, MD 10/17/23 [phone]

## 2023-10-17 NOTE — ED Triage Notes (Signed)
 Pt presents with LUQ abd pain that started last PM. The pain has been constant and described as a stabbing pain and does not radiate. She does have associated nausea and diarrhea. Denies vomiting. She does not think she ate much yesterday before the pain. Pt has been on Ozempic for 7 months without problems.   Additionally she has had intermittent CP and ShOB for last 3 months. She has been seen by her PCP for this.

## 2023-10-18 NOTE — Progress Notes (Deleted)
  Cardiology Office Note   Date:  10/18/2023  ID:  Jacalynn Buzzell, DOB 24-Aug-1985, MRN [id number] PCP: [facility], [facility] Providers Cardiologist:  None { Click to update primary MD,subspecialty MD or APP then REFRESH:1}    History of Present Illness Joan Alexander is a 38 y.o. female PMH HTN, DM 2 who presents for further evaluation management of chest discomfort.  Patient seen in the ED back 05/2023 for chest discomfort.  She was monitored for a period and serial troponin were negative.  No high risk features were identified, so follow-up with primary care was encouraged.  Relevant CVD History -TTE 07/2017 normal biventricular function, mild TR   ROS: Pt denies any chest discomfort, jaw pain, arm pain, palpitations, syncope, presyncope, orthopnea, PND, or LE edema.  Studies Reviewed I have independently reviewed the patient's ECG, ***.  Physical Exam VS:  LMP 06/15/2023 (Approximate)        Wt Readings from Last 3 Encounters:  10/17/23 210 lb (95.3 kg)  07/07/23 214 lb (97.1 kg)  06/29/23 214 lb (97.1 kg)    GEN: No acute distress. NECK: No JVD; No carotid bruits. CARDIAC: ***RRR, no murmurs, rubs, gallops. RESPIRATORY:  Clear to auscultation. EXTREMITIES:  Warm and well-perfused. No edema.  ASSESSMENT AND PLAN Chest discomfort ***        {Are you ordering a CV Procedure (e.g. stress test, cath, DCCV, TEE, etc)?   Press F2        :[id number]}  Dispo: ***  Signed, Caron Poser, MD

## 2023-10-19 ENCOUNTER — Ambulatory Visit

## 2023-11-05 NOTE — Progress Notes (Deleted)
  Cardiology Office Note   Date:  11/05/2023  ID:  Joan Alexander, DOB 03-04-1985, MRN [id number] PCP: Center, Joan Alexander [facility] Providers Cardiologist:  None { Click to update primary MD,subspecialty MD or APP then REFRESH:1}    History of Present Illness Joan Alexander is a 38 y.o. female PMH HTN, DM 2 who presents for further evaluation management of chest discomfort.  Patient seen in the ED back 05/2023 for chest discomfort.  She was monitored for a period and serial troponin were negative.  No high risk features were identified, so follow-up with primary care was encouraged.  Relevant CVD History -TTE 07/2017 normal biventricular function, mild TR   ROS: Pt denies any chest discomfort, jaw pain, arm pain, palpitations, syncope, presyncope, orthopnea, PND, or LE edema.  Studies Reviewed I have independently reviewed the patient's ECG, ***.  Physical Exam VS:  LMP 06/15/2023 (Approximate)        Wt Readings from Last 3 Encounters:  10/17/23 210 lb (95.3 kg)  07/07/23 214 lb (97.1 kg)  06/29/23 214 lb (97.1 kg)    GEN: No acute distress. NECK: No JVD; No carotid bruits. CARDIAC: ***RRR, no murmurs, rubs, gallops. RESPIRATORY:  Clear to auscultation. EXTREMITIES:  Warm and well-perfused. No edema.  ASSESSMENT AND PLAN Chest discomfort ***        {Are you ordering a CV Procedure (e.g. stress test, cath, DCCV, TEE, etc)?   Press F2        :[id number]}  Dispo: ***  Signed, Joan Poser, MD

## 2023-11-15 ENCOUNTER — Ambulatory Visit
